# Patient Record
Sex: Female | Born: 1985 | Race: White | Hispanic: No | Marital: Married | Smoking: Former smoker
Health system: Southern US, Community
[De-identification: ages and names within clinical notes are randomized; demographics above are authoritative.]

## PROBLEM LIST (undated history)

## (undated) DIAGNOSIS — N390 Urinary tract infection, site not specified: Secondary | ICD-10-CM

## (undated) HISTORY — PX: TONSILLECTOMY: SUR1361

---
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
## 2005-03-10 ENCOUNTER — Inpatient Hospital Stay (HOSPITAL_COMMUNITY): Admission: AD | Admit: 2005-03-10 | Discharge: 2005-03-16 | Payer: Self-pay | Admitting: Obstetrics and Gynecology

## 2005-09-05 ENCOUNTER — Emergency Department (HOSPITAL_COMMUNITY): Admission: EM | Admit: 2005-09-05 | Discharge: 2005-09-05 | Payer: Self-pay | Admitting: Emergency Medicine

## 2007-03-05 ENCOUNTER — Emergency Department (HOSPITAL_COMMUNITY): Admission: EM | Admit: 2007-03-05 | Discharge: 2007-03-05 | Payer: Self-pay | Admitting: *Deleted

## 2007-03-07 ENCOUNTER — Emergency Department (HOSPITAL_COMMUNITY): Admission: EM | Admit: 2007-03-07 | Discharge: 2007-03-07 | Payer: Self-pay | Admitting: Emergency Medicine

## 2007-03-25 ENCOUNTER — Emergency Department (HOSPITAL_COMMUNITY): Admission: EM | Admit: 2007-03-25 | Discharge: 2007-03-25 | Payer: Self-pay | Admitting: Emergency Medicine

## 2007-10-02 ENCOUNTER — Emergency Department (HOSPITAL_COMMUNITY): Admission: EM | Admit: 2007-10-02 | Discharge: 2007-10-02 | Payer: Self-pay | Admitting: Emergency Medicine

## 2007-10-18 ENCOUNTER — Inpatient Hospital Stay (HOSPITAL_COMMUNITY): Admission: AD | Admit: 2007-10-18 | Discharge: 2007-10-20 | Payer: Self-pay | Admitting: Obstetrics & Gynecology

## 2008-08-29 ENCOUNTER — Emergency Department (HOSPITAL_BASED_OUTPATIENT_CLINIC_OR_DEPARTMENT_OTHER): Admission: EM | Admit: 2008-08-29 | Discharge: 2008-08-29 | Payer: Self-pay | Admitting: Emergency Medicine

## 2008-09-17 ENCOUNTER — Emergency Department (HOSPITAL_BASED_OUTPATIENT_CLINIC_OR_DEPARTMENT_OTHER): Admission: EM | Admit: 2008-09-17 | Discharge: 2008-09-17 | Payer: Self-pay | Admitting: Emergency Medicine

## 2008-10-28 ENCOUNTER — Emergency Department (HOSPITAL_COMMUNITY): Admission: EM | Admit: 2008-10-28 | Discharge: 2008-10-28 | Payer: Self-pay | Admitting: Emergency Medicine

## 2008-11-06 ENCOUNTER — Emergency Department (HOSPITAL_BASED_OUTPATIENT_CLINIC_OR_DEPARTMENT_OTHER): Admission: EM | Admit: 2008-11-06 | Discharge: 2008-11-06 | Payer: Self-pay | Admitting: Emergency Medicine

## 2009-02-10 IMAGING — US US OB COMP LESS 14 WK
2 series · 14 of 27 positions shown · non-contrast
Comparison: none

HISTORY: Early pregnancy, vomiting, pain

[Series 1: unknown · 0.23mm/px · 2 of 4 slices shown (1 of 2)]
[im 1/4]
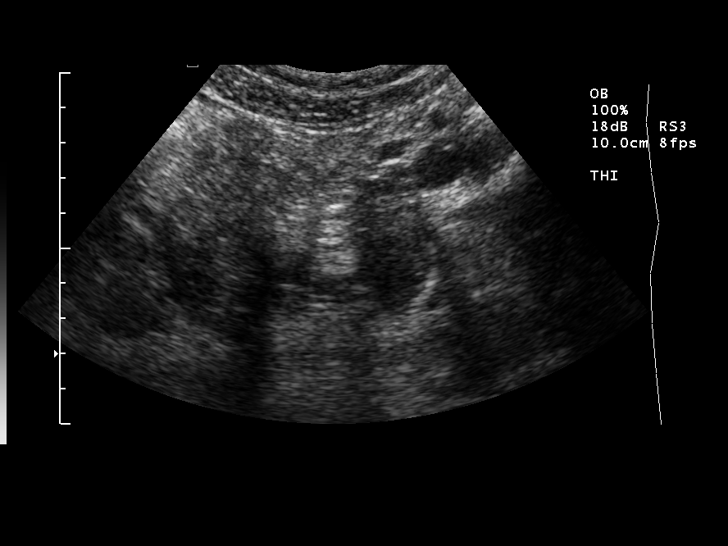
[im 3/4]
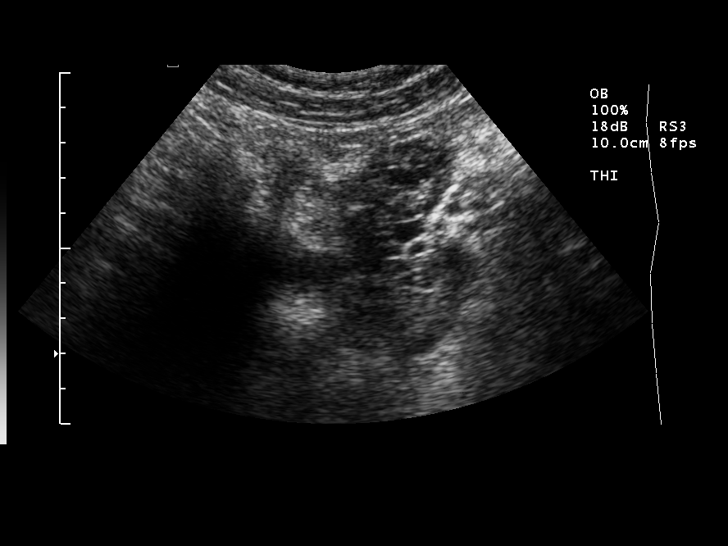

[Series 1: unknown · 0.27mm/px · 12 of 23 slices shown (2 of 2)]
[im 1/23]
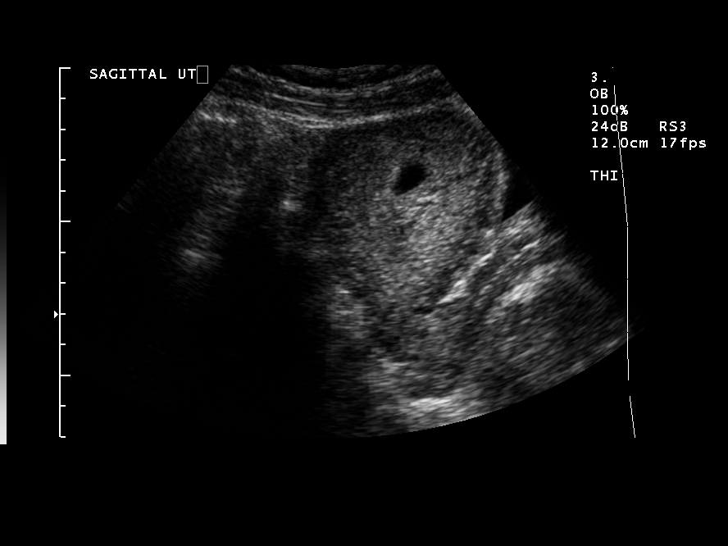
[im 3/23]
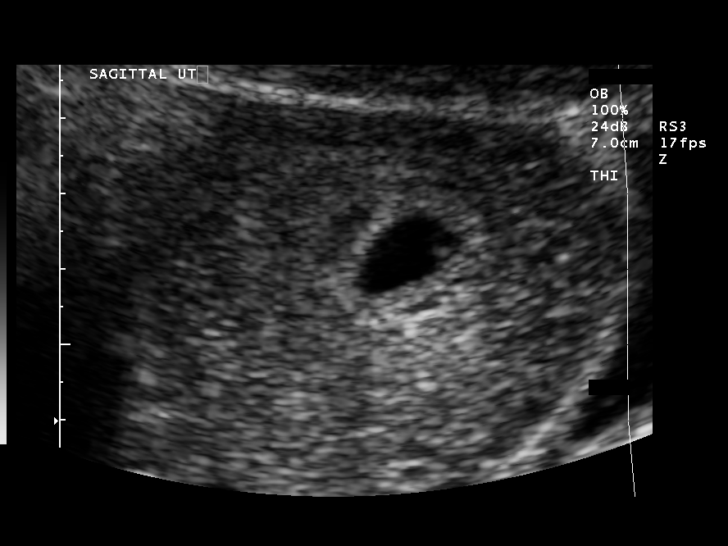
[im 5/23]
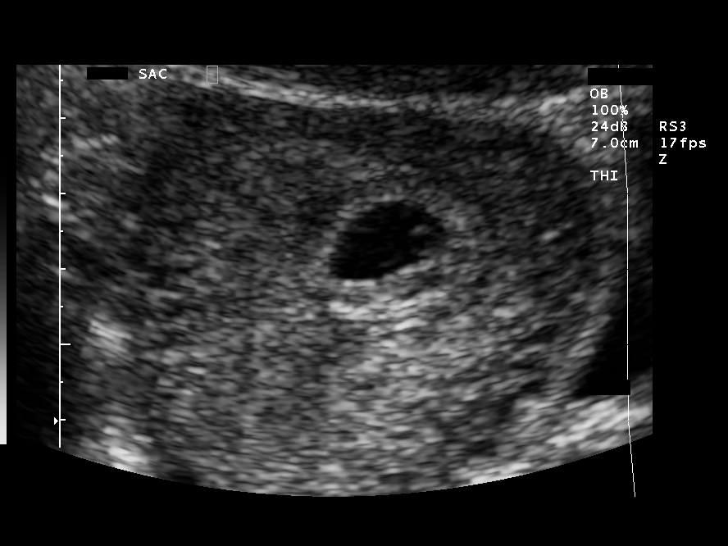
[im 7/23]
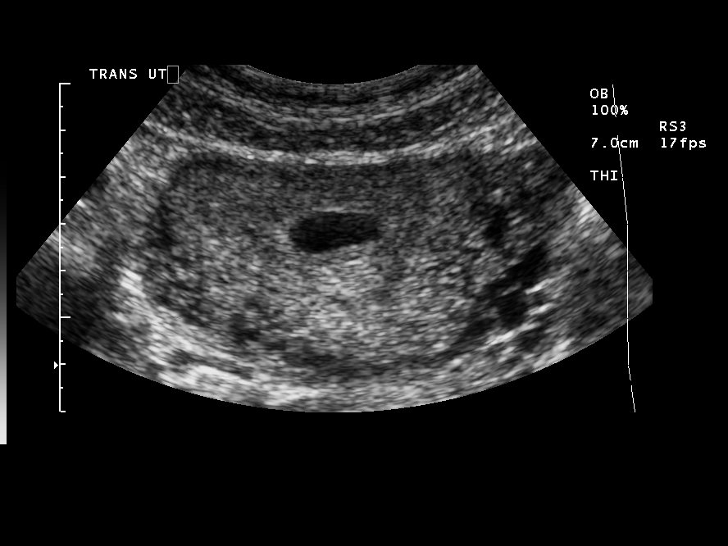
[im 9/23]
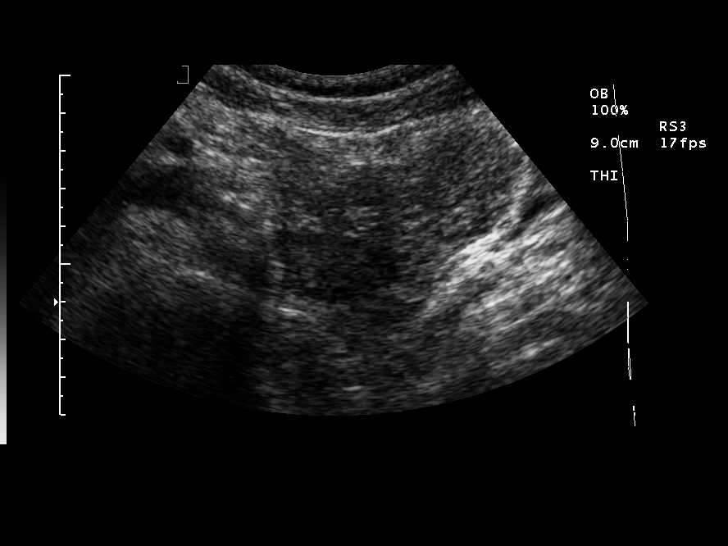
[im 11/23]
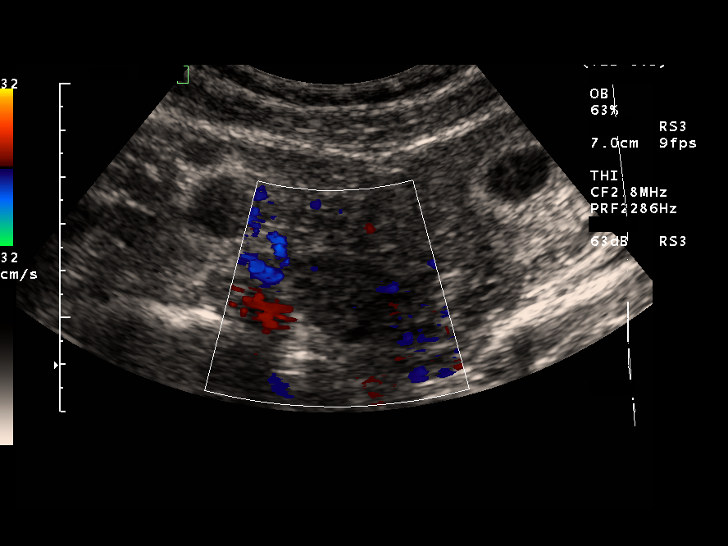
[im 13/23]
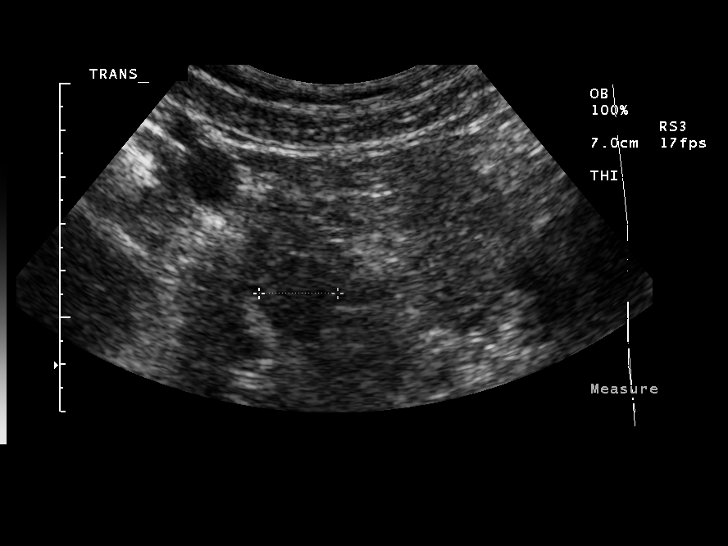
[im 15/23]
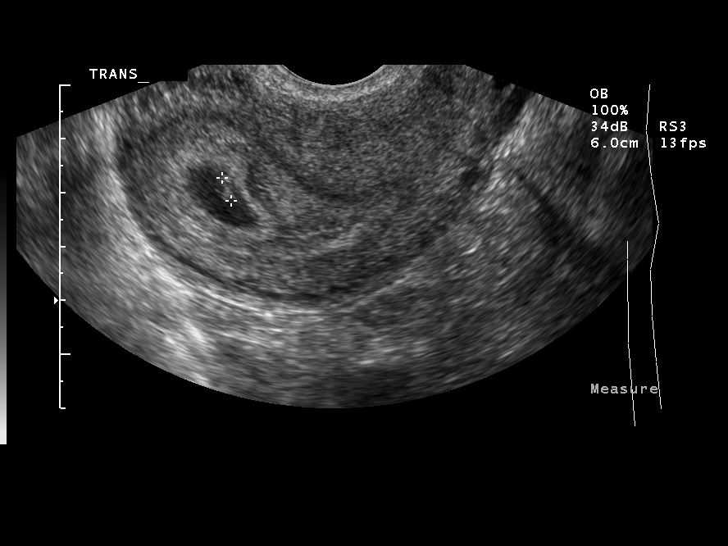
[im 17/23]
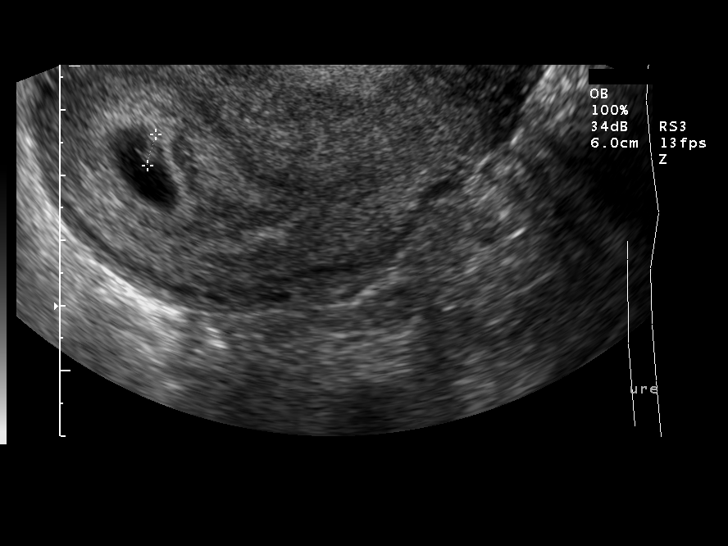
[im 19/23]
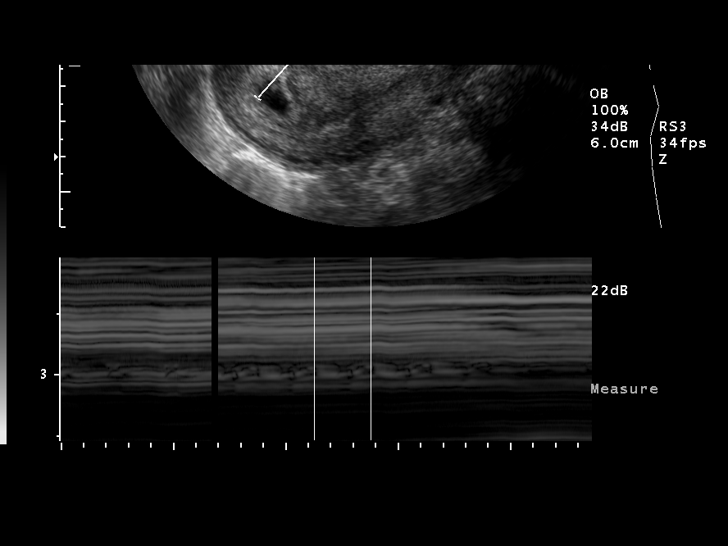
[im 21/23]
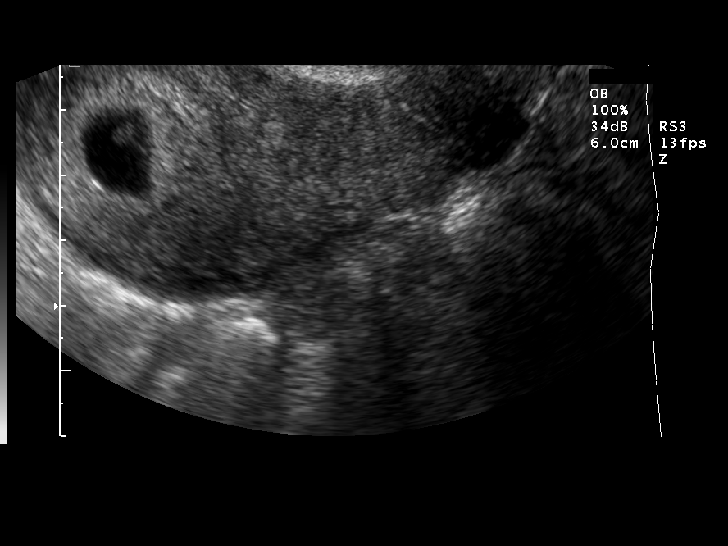
[im 23/23]
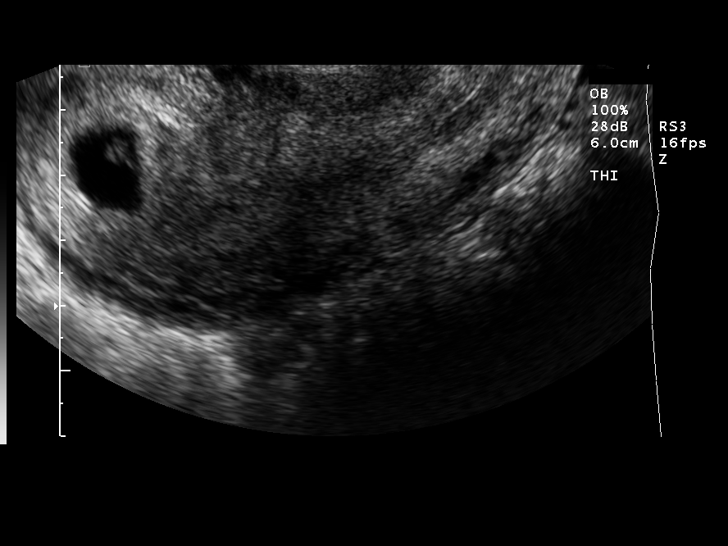

[14 of 27 positions shown; findings below may reference images not displayed]

ULTRASOUND OBSTETRIC COMPLETE LESS THAN 14 WEEKS:

Transabdominal sonography of gravid pelvis performed.

Gestational sac seen within uterus containing yolk sac and fetal pole.
Fetal cardiac activity detected at 120 beats per minute.
No subchorionic hemorrhage.
Crown-rump length 4.9 mm, corresponding to gestational age 6 weeks 2 days.
Right ovary normal size and morphology, 3.4 x 1.7 x 1.7 cm.
Left ovary normal size and morphology, 4.0 x 2.6 x 2.1 cm.
No free pelvic fluid or adnexal mass.
IMPRESSION: Single live intrauterine gestation measured at 6 weeks 2 days EGA, which
corresponds to an EDC of 10/29/2007.
No acute abnormalities.

## 2009-02-10 IMAGING — US US ABDOMEN COMPLETE
1 series · 14 of 25 positions shown · non-contrast
Comparison: none

HISTORY: Pregnant, vomiting

ULTRASOUND ABDOMEN COMPLETE:
TECHNIQUE: Complete abdominal ultrasound examination was performed including
evaluation of the liver, gallbladder, bile ducts, pancreas, kidneys, spleen,
IVC, and abdominal aorta.
Gallbladder normal without stones or wall thickening.
Common bile duct normal caliber, 3 mm diameter.
Liver, pancreas, and spleen normal appearance, with spleen 9.9 cm length.
Kidneys normal appearance, right 11.1 cm length and left 12.7 cm.
Aorta and IVC normal.
No ascites.

[Series 1: unknown · 0.28mm/px · 14 of 72 slices shown]
[im 1/72]
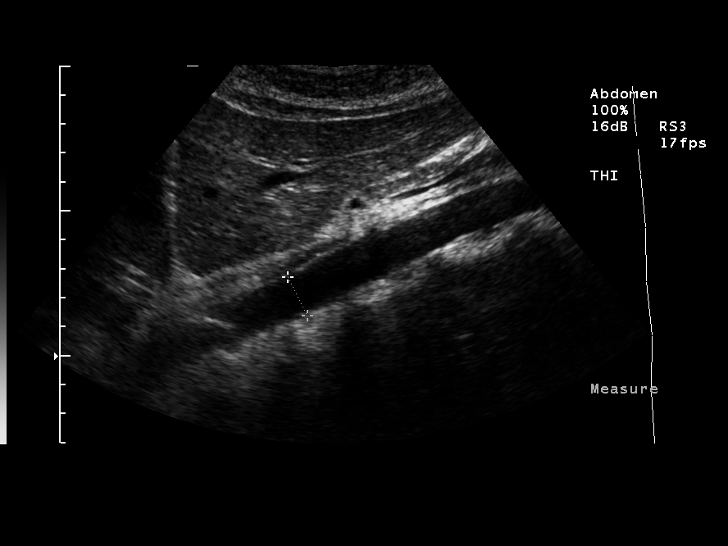
[im 6/72]
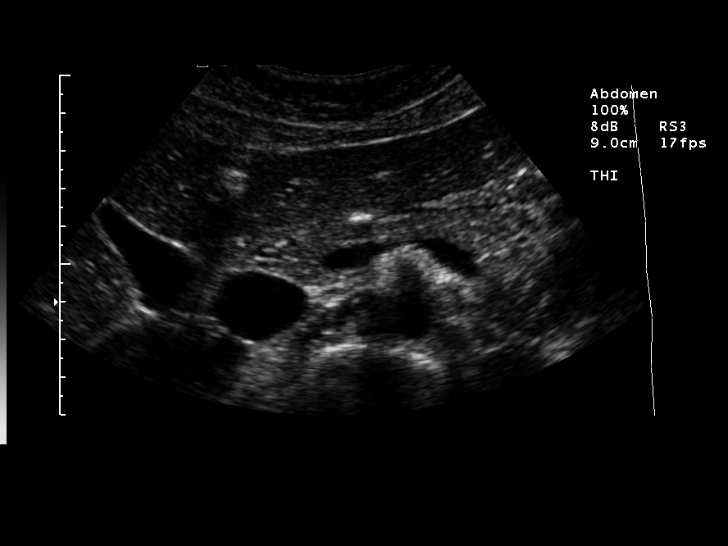
[im 12/72]
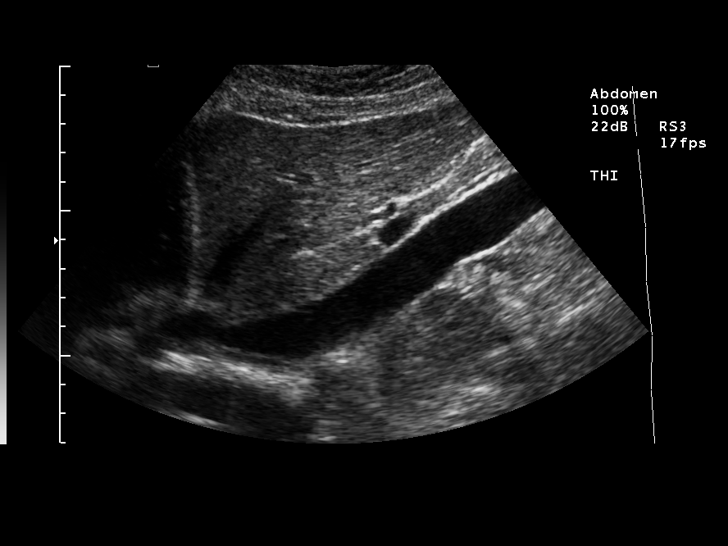
[im 18/72]
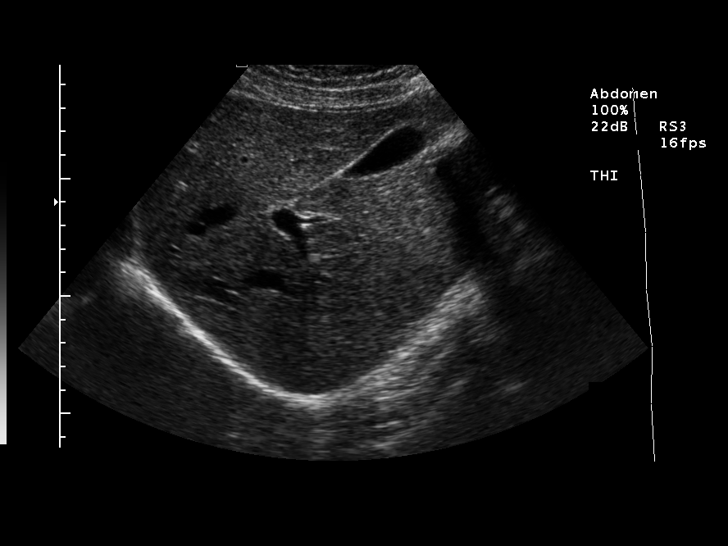
[im 24/72]
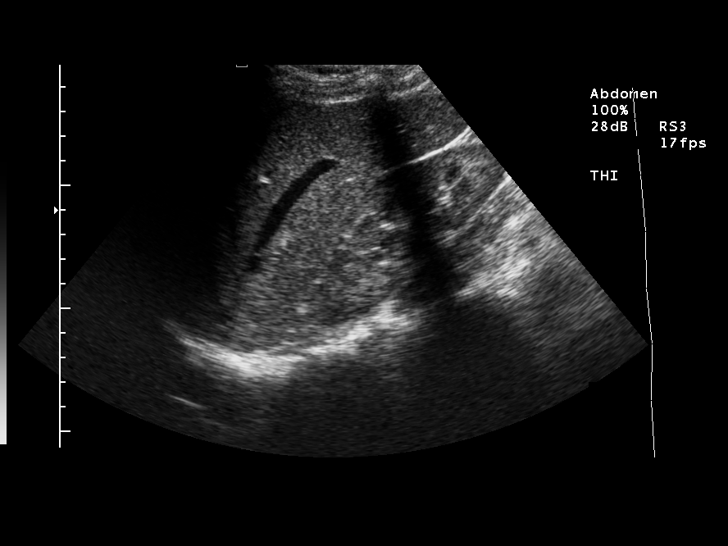
[im 27/72]
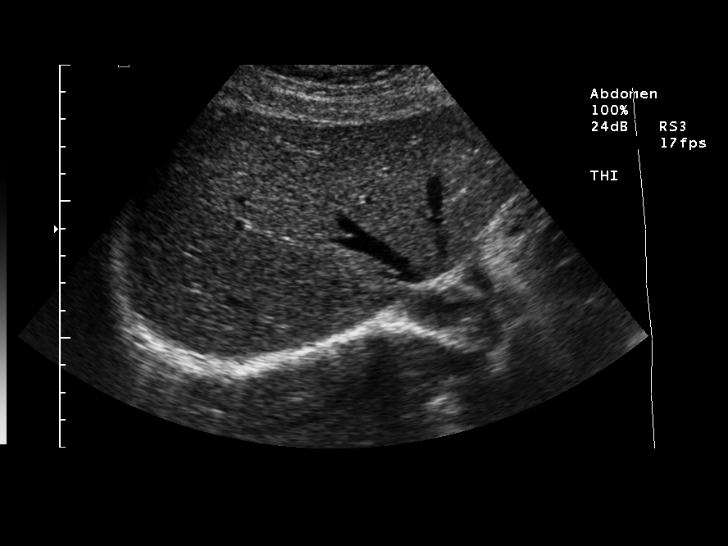
[im 33/72]
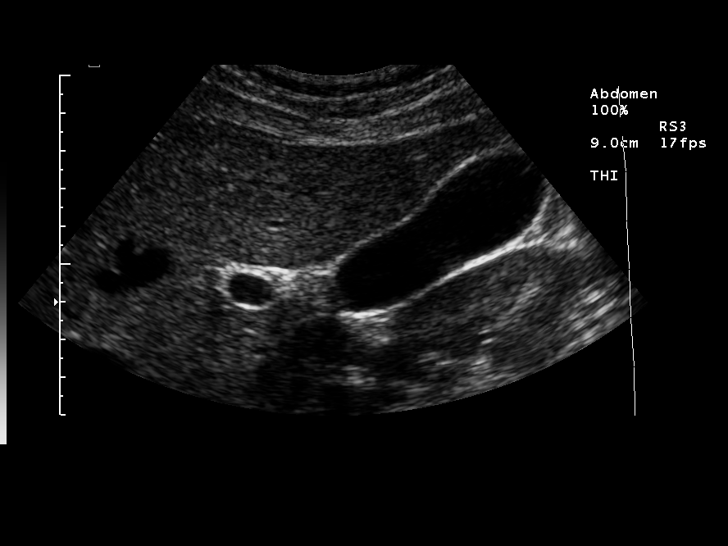
[im 39/72]
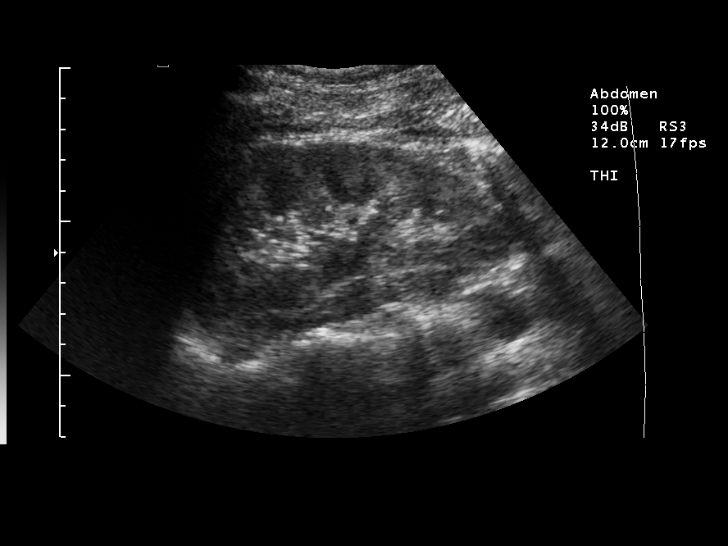
[im 45/72]
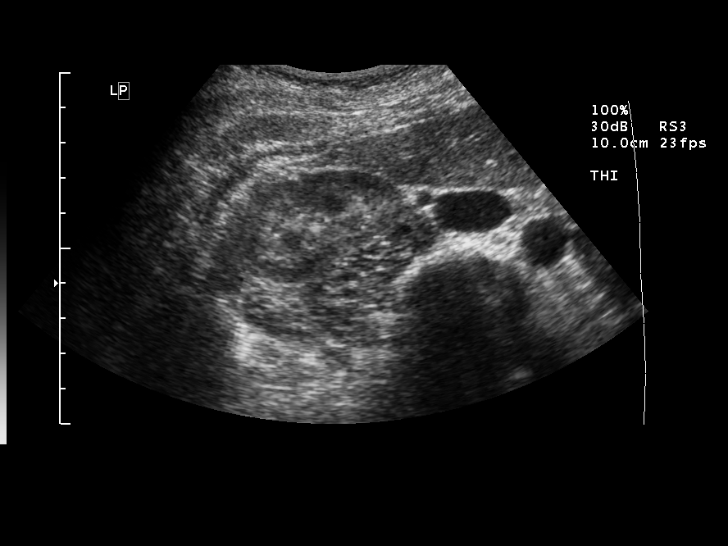
[im 48/72]
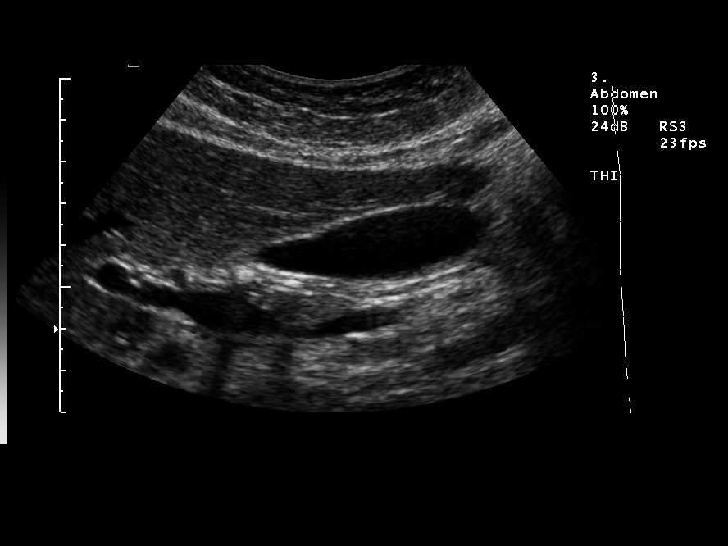
[im 54/72]
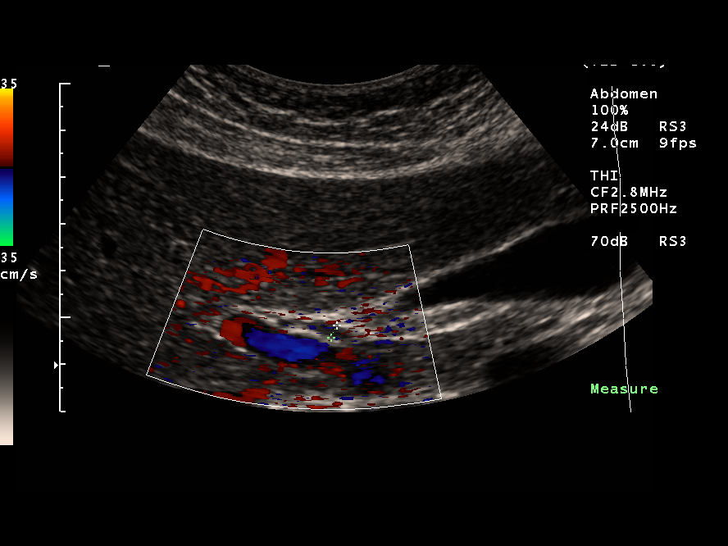
[im 60/72]
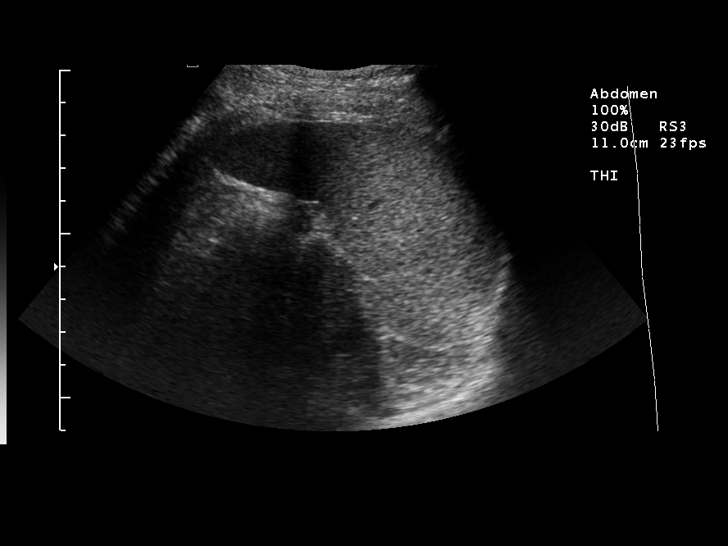
[im 66/72]
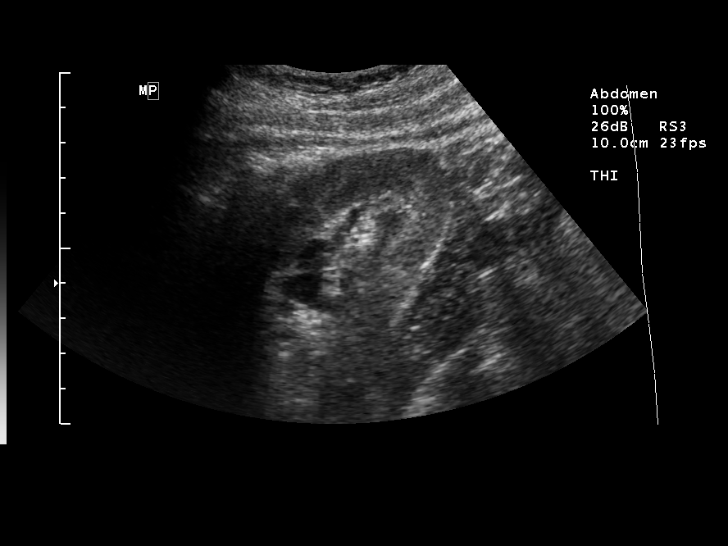
[im 72/72]
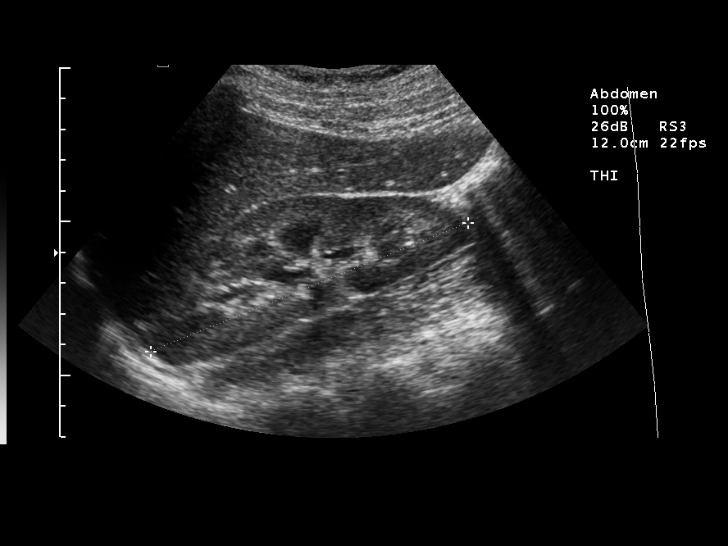

[14 of 25 positions shown; findings below may reference images not displayed]

IMPRESSION: Normal ultrasound abdomen.

## 2010-07-18 ENCOUNTER — Emergency Department (HOSPITAL_COMMUNITY)
Admission: EM | Admit: 2010-07-18 | Discharge: 2010-07-18 | Payer: Self-pay | Source: Home / Self Care | Admitting: Emergency Medicine

## 2010-08-03 NOTE — L&D Delivery Note (Signed)
Delivery Note At 10:50 PM a viable and healthy female was delivered via Vaginal, Spontaneous Delivery (Presentation: Left Occiput Anterior).  APGAR: 9, 9; weight 6 lb 14.9 oz (3144 g).   Placenta status: Intact, Spontaneous.  Cord: 3 vessels with the following complications: None.   Anesthesia: Epidural  Episiotomy: None Lacerations: None Suture Repair: none needed Est. Blood Loss (mL): 150  Mom to postpartum.  Baby to nursery-stable.  Chetan Kapat 06/02/2011, 11:19 PM  Present at birth and agree with above. Cam Hai 06/02/2011 11:22 PM

## 2010-10-13 LAB — URINALYSIS, ROUTINE W REFLEX MICROSCOPIC
Bilirubin Urine: NEGATIVE
Glucose, UA: NEGATIVE mg/dL
Ketones, ur: NEGATIVE mg/dL
Nitrite: NEGATIVE
Protein, ur: NEGATIVE mg/dL
Specific Gravity, Urine: 1.025 (ref 1.005–1.030)
Urobilinogen, UA: 0.2 mg/dL (ref 0.0–1.0)
pH: 6 (ref 5.0–8.0)

## 2010-10-13 LAB — URINE CULTURE
Colony Count: 10000
Culture  Setup Time: 201112170142

## 2010-10-13 LAB — GC/CHLAMYDIA PROBE AMP, GENITAL
Chlamydia, DNA Probe: POSITIVE — AB
GC Probe Amp, Genital: NEGATIVE

## 2010-10-13 LAB — WET PREP, GENITAL
Trich, Wet Prep: NONE SEEN
Yeast Wet Prep HPF POC: NONE SEEN

## 2010-10-13 LAB — URINE MICROSCOPIC-ADD ON

## 2010-10-13 LAB — POCT PREGNANCY, URINE: Preg Test, Ur: NEGATIVE

## 2010-10-16 LAB — ANTIBODY SCREEN: Antibody Screen: NEGATIVE

## 2010-10-16 LAB — RPR: RPR: NONREACTIVE

## 2010-10-16 LAB — RUBELLA ANTIBODY, IGM: Rubella: UNDETERMINED

## 2010-10-16 LAB — GC/CHLAMYDIA PROBE AMP, GENITAL
Chlamydia: NEGATIVE
Gonorrhea: NEGATIVE

## 2010-10-20 ENCOUNTER — Emergency Department (HOSPITAL_COMMUNITY)
Admission: EM | Admit: 2010-10-20 | Discharge: 2010-10-20 | Disposition: A | Discharge status: Home / Self Care | Payer: Medicaid Other | Attending: Emergency Medicine | Admitting: Emergency Medicine

## 2010-10-20 DIAGNOSIS — O210 Mild hyperemesis gravidarum: Secondary | ICD-10-CM | POA: Insufficient documentation

## 2010-10-20 LAB — DIFFERENTIAL
Basophils Absolute: 0 10*3/uL (ref 0.0–0.1)
Basophils Relative: 0 % (ref 0–1)
Eosinophils Absolute: 0.1 10*3/uL (ref 0.0–0.7)
Eosinophils Relative: 1 % (ref 0–5)
Lymphocytes Relative: 30 % (ref 12–46)
Lymphs Abs: 2.9 10*3/uL (ref 0.7–4.0)
Monocytes Absolute: 1 10*3/uL (ref 0.1–1.0)
Monocytes Relative: 10 % (ref 3–12)
Neutro Abs: 5.7 10*3/uL (ref 1.7–7.7)
Neutrophils Relative %: 59 % (ref 43–77)

## 2010-10-20 LAB — COMPREHENSIVE METABOLIC PANEL
ALT: 14 U/L (ref 0–35)
AST: 16 U/L (ref 0–37)
Albumin: 4 g/dL (ref 3.5–5.2)
Alkaline Phosphatase: 52 U/L (ref 39–117)
BUN: 10 mg/dL (ref 6–23)
CO2: 20 mEq/L (ref 19–32)
Calcium: 9.3 mg/dL (ref 8.4–10.5)
Chloride: 104 mEq/L (ref 96–112)
Creatinine, Ser: 0.5 mg/dL (ref 0.4–1.2)
GFR calc Af Amer: >60 mL/min (ref >60)
GFR calc non Af Amer: >60 mL/min (ref >60)
Glucose, Bld: 85 mg/dL (ref 70–99)
Potassium: 3.6 mEq/L (ref 3.5–5.1)
Sodium: 136 mEq/L (ref 135–145)
Total Bilirubin: 0.8 mg/dL (ref 0.3–1.2)
Total Protein: 6.6 g/dL (ref 6.0–8.3)

## 2010-10-20 LAB — CBC
HCT: 35.2 % — ABNORMAL LOW (ref 36.0–46.0)
Hemoglobin: 12.2 g/dL (ref 12.0–15.0)
MCH: 30.8 pg (ref 26.0–34.0)
MCHC: 34.7 g/dL (ref 30.0–36.0)
MCV: 88.9 fL (ref 78.0–100.0)
Platelets: 337 10*3/uL (ref 150–400)
RBC: 3.96 MIL/uL (ref 3.87–5.11)
RDW: 13.6 % (ref 11.5–15.5)
WBC: 9.7 10*3/uL (ref 4.0–10.5)

## 2010-10-20 LAB — LIPASE, BLOOD: Lipase: 22 U/L (ref 11–59)

## 2010-11-11 ENCOUNTER — Other Ambulatory Visit: Payer: Self-pay | Admitting: Obstetrics & Gynecology

## 2010-11-11 ENCOUNTER — Other Ambulatory Visit (HOSPITAL_COMMUNITY)
Admission: RE | Admit: 2010-11-11 | Discharge: 2010-11-11 | Disposition: A | Discharge status: Home / Self Care | Payer: Medicaid Other | Source: Ambulatory Visit | Attending: Obstetrics & Gynecology | Admitting: Obstetrics & Gynecology

## 2010-11-11 DIAGNOSIS — Z01419 Encounter for gynecological examination (general) (routine) without abnormal findings: Secondary | ICD-10-CM | POA: Insufficient documentation

## 2010-11-11 DIAGNOSIS — Z113 Encounter for screening for infections with a predominantly sexual mode of transmission: Secondary | ICD-10-CM | POA: Insufficient documentation

## 2010-11-12 LAB — URINALYSIS, ROUTINE W REFLEX MICROSCOPIC
Glucose, UA: NEGATIVE mg/dL
Ketones, ur: 15 mg/dL — AB
Nitrite: NEGATIVE
Protein, ur: 100 mg/dL — AB
Specific Gravity, Urine: 1.035 — ABNORMAL HIGH (ref 1.005–1.030)
Urobilinogen, UA: 0.2 mg/dL (ref 0.0–1.0)
pH: 5.5 (ref 5.0–8.0)

## 2010-11-12 LAB — URINE MICROSCOPIC-ADD ON

## 2010-11-12 LAB — DIFFERENTIAL
Basophils Absolute: 0 10*3/uL (ref 0.0–0.1)
Basophils Relative: 0 % (ref 0–1)
Eosinophils Absolute: 0 10*3/uL (ref 0.0–0.7)
Eosinophils Relative: 0 % (ref 0–5)
Lymphocytes Relative: 6 % — ABNORMAL LOW (ref 12–46)
Lymphs Abs: 1.5 10*3/uL (ref 0.7–4.0)
Monocytes Absolute: 1 10*3/uL (ref 0.1–1.0)
Monocytes Relative: 4 % (ref 3–12)
Neutro Abs: 22.1 10*3/uL — ABNORMAL HIGH (ref 1.7–7.7)
Neutrophils Relative %: 90 % — ABNORMAL HIGH (ref 43–77)

## 2010-11-12 LAB — CBC
HCT: 44.6 % (ref 36.0–46.0)
Hemoglobin: 15.3 g/dL — ABNORMAL HIGH (ref 12.0–15.0)
MCHC: 34.2 g/dL (ref 30.0–36.0)
MCV: 91.2 fL (ref 78.0–100.0)
Platelets: 402 10*3/uL — ABNORMAL HIGH (ref 150–400)
RBC: 4.89 MIL/uL (ref 3.87–5.11)
RDW: 13.5 % (ref 11.5–15.5)
WBC: 24.6 10*3/uL — ABNORMAL HIGH (ref 4.0–10.5)

## 2010-11-12 LAB — BASIC METABOLIC PANEL
BUN: 17 mg/dL (ref 6–23)
CO2: 18 mEq/L — ABNORMAL LOW (ref 19–32)
Calcium: 10.1 mg/dL (ref 8.4–10.5)
Chloride: 109 mEq/L (ref 96–112)
Creatinine, Ser: 0.6 mg/dL (ref 0.4–1.2)
GFR calc Af Amer: >60 mL/min (ref >60)
GFR calc non Af Amer: >60 mL/min (ref >60)
Glucose, Bld: 122 mg/dL — ABNORMAL HIGH (ref 70–99)
Potassium: 5.2 mEq/L — ABNORMAL HIGH (ref 3.5–5.1)
Sodium: 143 mEq/L (ref 135–145)

## 2010-11-12 LAB — PREGNANCY, URINE: Preg Test, Ur: NEGATIVE

## 2010-11-25 ENCOUNTER — Emergency Department (HOSPITAL_BASED_OUTPATIENT_CLINIC_OR_DEPARTMENT_OTHER)
Admission: EM | Admit: 2010-11-25 | Discharge: 2010-11-25 | Disposition: A | Discharge status: Home / Self Care | Payer: Medicaid Other | Attending: Emergency Medicine | Admitting: Emergency Medicine

## 2010-11-25 DIAGNOSIS — O2390 Unspecified genitourinary tract infection in pregnancy, unspecified trimester: Secondary | ICD-10-CM | POA: Insufficient documentation

## 2010-11-25 DIAGNOSIS — N390 Urinary tract infection, site not specified: Secondary | ICD-10-CM | POA: Insufficient documentation

## 2010-11-25 DIAGNOSIS — O210 Mild hyperemesis gravidarum: Secondary | ICD-10-CM | POA: Insufficient documentation

## 2010-11-25 DIAGNOSIS — K219 Gastro-esophageal reflux disease without esophagitis: Secondary | ICD-10-CM | POA: Insufficient documentation

## 2010-11-25 LAB — BASIC METABOLIC PANEL
CO2: 23 mEq/L (ref 19–32)
Glucose, Bld: 79 mg/dL (ref 70–99)
Potassium: 4 mEq/L (ref 3.5–5.1)
Sodium: 140 mEq/L (ref 135–145)

## 2010-11-25 LAB — DIFFERENTIAL
Basophils Relative: 0 % (ref 0–1)
Eosinophils Absolute: 0.1 10*3/uL (ref 0.0–0.7)
Eosinophils Relative: 1 % (ref 0–5)
Lymphs Abs: 2.2 10*3/uL (ref 0.7–4.0)

## 2010-11-25 LAB — URINALYSIS, ROUTINE W REFLEX MICROSCOPIC
Bilirubin Urine: NEGATIVE
Ketones, ur: NEGATIVE mg/dL
Nitrite: NEGATIVE
Specific Gravity, Urine: 1.023 (ref 1.005–1.030)
Urobilinogen, UA: 1 mg/dL (ref 0.0–1.0)

## 2010-11-25 LAB — URINE MICROSCOPIC-ADD ON

## 2010-11-25 LAB — CBC
MCV: 87.8 fL (ref 78.0–100.0)
Platelets: 328 10*3/uL (ref 150–400)
RDW: 12.7 % (ref 11.5–15.5)
WBC: 10 10*3/uL (ref 4.0–10.5)

## 2010-11-26 LAB — URINE CULTURE
Colony Count: 30000
Culture  Setup Time: 201204242005

## 2011-04-24 ENCOUNTER — Inpatient Hospital Stay (HOSPITAL_COMMUNITY)
Admission: AD | Admit: 2011-04-24 | Discharge: 2011-04-24 | Disposition: A | Discharge status: Home / Self Care | Payer: Medicaid Other | Source: Ambulatory Visit | Attending: Obstetrics & Gynecology | Admitting: Obstetrics & Gynecology

## 2011-04-24 ENCOUNTER — Encounter (HOSPITAL_COMMUNITY): Payer: Self-pay

## 2011-04-24 DIAGNOSIS — N949 Unspecified condition associated with female genital organs and menstrual cycle: Secondary | ICD-10-CM

## 2011-04-24 DIAGNOSIS — R1032 Left lower quadrant pain: Secondary | ICD-10-CM | POA: Insufficient documentation

## 2011-04-24 DIAGNOSIS — O99891 Other specified diseases and conditions complicating pregnancy: Secondary | ICD-10-CM | POA: Insufficient documentation

## 2011-04-24 LAB — URINE MICROSCOPIC-ADD ON

## 2011-04-24 LAB — URINALYSIS, ROUTINE W REFLEX MICROSCOPIC
Ketones, ur: NEGATIVE mg/dL
Nitrite: NEGATIVE
Protein, ur: NEGATIVE mg/dL

## 2011-04-24 MED ORDER — CYCLOBENZAPRINE HCL 10 MG PO TABS: 10.0000 mg | ORAL_TABLET | Freq: Three times a day (TID) | ORAL | Status: AC | PRN

## 2011-04-24 NOTE — Progress Notes (Signed)
Pt states she started having abdominal pain this am, getting more painful and worse with movement. No leaking or bleeding and reports good fetal movement.

## 2011-04-24 NOTE — ED Provider Notes (Signed)
History     Chief Complaint  Patient presents with  . Abdominal Pain   HPI [name] is a 25 year old G3P1102 at [redacted]w[redacted]d presenting with LLQ pain.  The pain started this morning.  It is described as sharp, does occasionally shot down into the groin.  Worse with movement, better with rest.  Has not tried anything yet. Has had some mild nausea, no vomiting, has 4 loose stools this morning, but none since.  Also states having some "leaking" for the last several weeks of a clear fluid with a "rubber-like" odor.  Denies vaginal discharge, vaginal itching, dysuria.  States did not know she was in labor with prior pregnancies (one was preterm at 36 weeks).  Denies vaginal bleeding, headaches, vision changes, edema.  OB History    Grav Para Term Preterm Abortions TAB SAB Ect Mult Living   3 2 1 1      2       Past Medical History  Diagnosis Date  . No pertinent past medical history     Past Surgical History  Procedure Date  . Tonsillectomy     No family history on file.  History  Substance Use Topics  . Smoking status: Former Games developer  . Smokeless tobacco: Not on file  . Alcohol Use: No    Allergies: No Known Allergies  Prescriptions prior to admission  Medication Sig Dispense Refill  . guaifenesin (ROBITUSSIN) 100 MG/5ML syrup Take 200 mg by mouth 3 (three) times daily as needed. cough       . Pediatric Multivit-Minerals-C (GUMMI BEAR MULTIVITAMIN/MIN) CHEW Chew 2 tablets by mouth daily.          Review of Systems  Constitutional: Negative for fever and chills.  HENT: Negative for sore throat.   Eyes: Negative for blurred vision.  Respiratory: Negative for cough and shortness of breath.   Cardiovascular: Negative for palpitations.  Gastrointestinal: Positive for nausea and abdominal pain (see HPI). Negative for vomiting and diarrhea.  Genitourinary: Negative for dysuria.  Skin: Negative for rash.  Neurological: Negative for dizziness and headaches.   Physical Exam   Blood  pressure 126/70, pulse 94, temperature 97.9 F (36.6 C), temperature source Oral, resp. rate 20, height 5\' 3"  (1.6 m), weight 159 lb (72.122 kg), SpO2 98.00%.  Physical Exam  Constitutional: She is oriented to person, place, and time. She appears well-developed and well-nourished. No distress.  HENT:  Head: Normocephalic and atraumatic.  Mouth/Throat: Oropharynx is clear and moist.  Eyes: No scleral icterus.  Neck: Normal range of motion. Neck supple.  Cardiovascular: Normal rate, regular rhythm and intact distal pulses.  Exam reveals no gallop.   No murmur heard. Respiratory: Effort normal and breath sounds normal. She has no wheezes. She has no rales.  GI: Soft. Bowel sounds are normal.       Gravid, fundal height 33cm, mild tenderness to palpation on left at level of umbilicus  Genitourinary:       Normal external genitalia.  Normal vagina, small amount of thin white discharge. No pooling.  Musculoskeletal: She exhibits no edema and no tenderness.  Neurological: She is alert and oriented to person, place, and time.  Skin: Skin is warm and dry. No rash noted.  Psychiatric: She has a normal mood and affect. Her behavior is normal.   Dilation: Fingertip Effacement (%): Thick Cervical Position: Posterior Presentation:  (OUT OF PELVIS ) Exam by:: DR. BOOTH   FHT: 140, moderate variability, accels present, decels absent Contractions: none  MAU Course  Procedures Fern test: negative  Urine dipstick shows negative for all components, positive for small leukocytes.   Assessment and Plan  25 year old G3P1102 at 33.6 with LLQ pain likely MSK vs round ligament vs early gastroenteritis -discussed tylenol for pain -will give flexeril as well -will give preterm labor precautions -discharge home  BOOTH, Maeson Purohit 04/24/2011, 6:54 PM

## 2011-04-27 LAB — CBC
HCT: 32.2 — ABNORMAL LOW
Hemoglobin: 11.3 — ABNORMAL LOW
MCHC: 34.8
MCHC: 35.2
MCV: 91.8
RBC: 3.09 — ABNORMAL LOW
RDW: 14.1

## 2011-04-27 NOTE — ED Provider Notes (Signed)
Agree 

## 2011-05-15 LAB — URINALYSIS, ROUTINE W REFLEX MICROSCOPIC
Bilirubin Urine: NEGATIVE
Glucose, UA: NEGATIVE
Ketones, ur: NEGATIVE
Protein, ur: NEGATIVE

## 2011-05-18 LAB — DIFFERENTIAL
Basophils Absolute: 0
Basophils Relative: 0
Eosinophils Absolute: 0
Eosinophils Relative: 0
Monocytes Absolute: 0.7
Monocytes Relative: 7
Neutro Abs: 7.8 — ABNORMAL HIGH

## 2011-05-18 LAB — URINALYSIS, ROUTINE W REFLEX MICROSCOPIC
Bilirubin Urine: NEGATIVE
Glucose, UA: NEGATIVE
Hgb urine dipstick: NEGATIVE
Ketones, ur: >80 — AB
Ketones, ur: >80 — AB
Nitrite: NEGATIVE
Protein, ur: NEGATIVE
Specific Gravity, Urine: 1.015
Urobilinogen, UA: 0.2
pH: 8.5 — ABNORMAL HIGH

## 2011-05-18 LAB — STREP B DNA PROBE: GBS: NEGATIVE

## 2011-05-18 LAB — WET PREP, GENITAL

## 2011-05-18 LAB — CBC
HCT: 37.4
MCV: 90
RBC: 4.15
WBC: 11.2 — ABNORMAL HIGH

## 2011-05-18 LAB — URINE CULTURE
Colony Count: NO GROWTH
Culture: NO GROWTH

## 2011-05-18 LAB — LIPASE, BLOOD: Lipase: 16

## 2011-05-18 LAB — URINE MICROSCOPIC-ADD ON

## 2011-05-30 ENCOUNTER — Inpatient Hospital Stay (HOSPITAL_COMMUNITY)
Admission: AD | Admit: 2011-05-30 | Discharge: 2011-05-30 | Disposition: A | Discharge status: Home / Self Care | Payer: Medicaid Other | Source: Ambulatory Visit | Attending: Family Medicine | Admitting: Family Medicine

## 2011-05-30 ENCOUNTER — Encounter (HOSPITAL_COMMUNITY): Payer: Self-pay | Admitting: *Deleted

## 2011-05-30 DIAGNOSIS — O99891 Other specified diseases and conditions complicating pregnancy: Secondary | ICD-10-CM | POA: Insufficient documentation

## 2011-05-30 DIAGNOSIS — O212 Late vomiting of pregnancy: Secondary | ICD-10-CM | POA: Insufficient documentation

## 2011-05-30 DIAGNOSIS — R109 Unspecified abdominal pain: Secondary | ICD-10-CM | POA: Insufficient documentation

## 2011-05-30 LAB — URINALYSIS, ROUTINE W REFLEX MICROSCOPIC
Bilirubin Urine: NEGATIVE
Glucose, UA: NEGATIVE mg/dL
Hgb urine dipstick: NEGATIVE
Protein, ur: NEGATIVE mg/dL
Urobilinogen, UA: 2 mg/dL — ABNORMAL HIGH (ref 0.0–1.0)

## 2011-05-30 LAB — POCT FERN TEST: Fern Test: NEGATIVE

## 2011-05-30 LAB — URINE MICROSCOPIC-ADD ON

## 2011-05-30 MED ORDER — PROMETHAZINE HCL 25 MG PO TABS
12.5000 mg | ORAL_TABLET | Freq: Four times a day (QID) | ORAL | Status: DC | PRN
  Administered 2011-05-30: 12.5 mg via ORAL
  Filled 2011-05-30: qty 1

## 2011-05-30 MED ORDER — OXYCODONE-ACETAMINOPHEN 5-325 MG PO TABS
1.0000 | ORAL_TABLET | Freq: Once | ORAL | Status: AC
  Administered 2011-05-30: 1 via ORAL
  Filled 2011-05-30: qty 1

## 2011-05-30 NOTE — ED Notes (Signed)
Dr Clinton Sawyer on unit

## 2011-05-30 NOTE — Progress Notes (Signed)
Pain is worse with walking and when lifts R leg

## 2011-05-30 NOTE — ED Notes (Signed)
Dr Clinton Sawyer notified of pt's admission and status. Will see pt

## 2011-05-30 NOTE — ED Provider Notes (Signed)
History     Chief Complaint  Patient presents with  . Pelvic Pain   HPI Patient report sharp shooting pain in lower abdomen and pelvis starting at 6:00 PM. Pain is intermittent and exacerbated by walking. She has not taken any medication to relieve it. She has not had pain like this earlier or pregnancy or with prior pregnancy. [name] does not think that she has had a rupture of membranes and denies vaginal bleeding, herpes outbreak, and recent trauma. Patient is concerned that the pain represents her going into labor. She says that her cervix was dilated to 1cm at her last OBGY visit on 05/26/11.  OB History    Grav Para Term Preterm Abortions TAB SAB Ect Mult Living   3 2 1 1      2       Past Medical History  Diagnosis Date  . No pertinent past medical history     Past Surgical History  Procedure Date  . Tonsillectomy     No family history on file.  History  Substance Use Topics  . Smoking status: Former Games developer  . Smokeless tobacco: Not on file  . Alcohol Use: No    Allergies: No Known Allergies  Prescriptions prior to admission  Medication Sig Dispense Refill  . guaifenesin (ROBITUSSIN) 100 MG/5ML syrup Take 200 mg by mouth 3 (three) times daily as needed. cough       . Pediatric Multivit-Minerals-C (GUMMI BEAR MULTIVITAMIN/MIN) CHEW Chew 2 tablets by mouth daily.          ROS Positive for nausea and vomiting x 2, urinary incontinence; Negative for fever and chills  Physical Exam   Blood pressure 123/67, pulse 84, temperature 97.5 F (36.4 C), temperature source Oral, resp. rate 20, height 5\' 2"  (1.575 m), weight 76.204 kg (168 lb).  Dilation: Closed Cervical Position: Posterior Exam by:: Dr Clinton Sawyer- fern slide made before sve  Fern Test: Negative   Physical Exam Vital signs normal Gen: Alert and oriented, mild discomfort Heart: RRR Lungs: CTA-B Abd: Gravid, non-tender Feet: minimal swelling, pulses intact   MAU Course  Procedures Tocometer:  irregular contractions every 10 minutes Fetoscope: baseline 140, moderate variability, accelerations present, 1 variable deceleration in 30 minutes  MDM No evidence of active labor   Assessment and Plan  Patient 38.0 EGA and not in active labor. 1. Pain - likely round ligament pain, give one percocet now 2. Nausea/Vomiting - promethazine mg po x 1 3. Follow-up: [facility] on 10/30   Mat Carne 05/30/2011, 2:42 AM

## 2011-05-30 NOTE — Progress Notes (Signed)
G3P2 at 38wks. Sharp pains in vagina since 1800. Worse with walking and when lifts R leg. Some white vag d/c.

## 2011-05-30 NOTE — Progress Notes (Signed)
Written and verbal d/c instructions given and understanding voiced. Pt d/c with spouse.

## 2011-05-30 NOTE — Progress Notes (Signed)
Dr Clinton Sawyer did spec exam and fern slide obtained. Pt tol well.

## 2011-06-02 ENCOUNTER — Encounter (HOSPITAL_COMMUNITY): Payer: Self-pay | Admitting: *Deleted

## 2011-06-02 ENCOUNTER — Encounter (HOSPITAL_COMMUNITY): Payer: Self-pay | Admitting: Anesthesiology

## 2011-06-02 ENCOUNTER — Inpatient Hospital Stay (HOSPITAL_COMMUNITY): Payer: Medicaid Other | Admitting: Anesthesiology

## 2011-06-02 ENCOUNTER — Inpatient Hospital Stay (HOSPITAL_COMMUNITY)
Admission: AD | Admit: 2011-06-02 | Discharge: 2011-06-04 | DRG: 775 | Disposition: A | Discharge status: Home / Self Care | Payer: Medicaid Other | Source: Ambulatory Visit | Attending: Obstetrics & Gynecology | Admitting: Obstetrics & Gynecology

## 2011-06-02 DIAGNOSIS — IMO0001 Reserved for inherently not codable concepts without codable children: Secondary | ICD-10-CM

## 2011-06-02 HISTORY — DX: Urinary tract infection, site not specified: N39.0

## 2011-06-02 LAB — CBC
Hemoglobin: 11 g/dL — ABNORMAL LOW (ref 12.0–15.0)
MCH: 29.7 pg (ref 26.0–34.0)
RBC: 3.7 MIL/uL — ABNORMAL LOW (ref 3.87–5.11)
WBC: 10.8 10*3/uL — ABNORMAL HIGH (ref 4.0–10.5)

## 2011-06-02 LAB — RPR: RPR Ser Ql: NONREACTIVE

## 2011-06-02 MED ORDER — FENTANYL 2.5 MCG/ML BUPIVACAINE 1/10 % EPIDURAL INFUSION (WH - ANES)
14.0000 mL/h | INTRAMUSCULAR | Status: DC
  Administered 2011-06-02 (×2): 14 mL/h via EPIDURAL
  Filled 2011-06-02 (×2): qty 60

## 2011-06-02 MED ORDER — DIPHENHYDRAMINE HCL 50 MG/ML IJ SOLN: 12.5000 mg | INTRAMUSCULAR | Status: DC | PRN

## 2011-06-02 MED ORDER — EPHEDRINE 5 MG/ML INJ
10.0000 mg | INTRAVENOUS | Status: DC | PRN
  Filled 2011-06-02: qty 4

## 2011-06-02 MED ORDER — TERBUTALINE SULFATE 1 MG/ML IJ SOLN: 0.2500 mg | Freq: Once | INTRAMUSCULAR | Status: AC | PRN

## 2011-06-02 MED ORDER — PHENYLEPHRINE 40 MCG/ML (10ML) SYRINGE FOR IV PUSH (FOR BLOOD PRESSURE SUPPORT)
80.0000 ug | PREFILLED_SYRINGE | INTRAVENOUS | Status: DC | PRN
  Filled 2011-06-02: qty 5

## 2011-06-02 MED ORDER — OXYCODONE-ACETAMINOPHEN 5-325 MG PO TABS: 2.0000 | ORAL_TABLET | ORAL | Status: DC | PRN

## 2011-06-02 MED ORDER — FENTANYL 2.5 MCG/ML BUPIVACAINE 1/10 % EPIDURAL INFUSION (WH - ANES)
INTRAMUSCULAR | Status: AC
  Administered 2011-06-02: 14 mL/h via EPIDURAL
  Filled 2011-06-02: qty 60

## 2011-06-02 MED ORDER — FENTANYL 2.5 MCG/ML BUPIVACAINE 1/10 % EPIDURAL INFUSION (WH - ANES)
INTRAMUSCULAR | Status: DC | PRN
  Administered 2011-06-02: 14 mL/h via EPIDURAL

## 2011-06-02 MED ORDER — OXYTOCIN 20 UNITS IN LACTATED RINGERS INFUSION - SIMPLE
1.0000 m[IU]/min | INTRAVENOUS | Status: DC
  Administered 2011-06-02: 2 m[IU]/min via INTRAVENOUS
  Administered 2011-06-02: 6 m[IU]/min via INTRAVENOUS

## 2011-06-02 MED ORDER — LIDOCAINE HCL 1.5 % IJ SOLN
INTRAMUSCULAR | Status: DC | PRN
  Administered 2011-06-02 (×2): 5 mL via EPIDURAL

## 2011-06-02 MED ORDER — FLEET ENEMA 7-19 GM/118ML RE ENEM: 1.0000 | ENEMA | RECTAL | Status: DC | PRN

## 2011-06-02 MED ORDER — ONDANSETRON HCL 4 MG/2ML IJ SOLN
4.0000 mg | Freq: Four times a day (QID) | INTRAMUSCULAR | Status: DC | PRN
  Administered 2011-06-02: 4 mg via INTRAVENOUS
  Filled 2011-06-02: qty 2

## 2011-06-02 MED ORDER — EPHEDRINE 5 MG/ML INJ
INTRAVENOUS | Status: AC
  Filled 2011-06-02: qty 4

## 2011-06-02 MED ORDER — ACETAMINOPHEN 325 MG PO TABS: 650.0000 mg | ORAL_TABLET | ORAL | Status: DC | PRN

## 2011-06-02 MED ORDER — OXYTOCIN 20 UNITS IN LACTATED RINGERS INFUSION - SIMPLE
125.0000 mL/h | Freq: Once | INTRAVENOUS | Status: AC
  Administered 2011-06-02: 125 mL/h via INTRAVENOUS

## 2011-06-02 MED ORDER — CITRIC ACID-SODIUM CITRATE 334-500 MG/5ML PO SOLN: 30.0000 mL | ORAL | Status: DC | PRN

## 2011-06-02 MED ORDER — LACTATED RINGERS IV SOLN
INTRAVENOUS | Status: DC
  Administered 2011-06-02 (×2): via INTRAVENOUS

## 2011-06-02 MED ORDER — NALBUPHINE SYRINGE 5 MG/0.5 ML
5.0000 mg | INJECTION | INTRAMUSCULAR | Status: DC | PRN
  Filled 2011-06-02: qty 0.5

## 2011-06-02 MED ORDER — PHENYLEPHRINE 40 MCG/ML (10ML) SYRINGE FOR IV PUSH (FOR BLOOD PRESSURE SUPPORT)
PREFILLED_SYRINGE | INTRAVENOUS | Status: AC
  Filled 2011-06-02: qty 5

## 2011-06-02 MED ORDER — OXYTOCIN BOLUS FROM INFUSION
500.0000 mL | Freq: Once | INTRAVENOUS | Status: DC
  Filled 2011-06-02: qty 500

## 2011-06-02 MED ORDER — IBUPROFEN 600 MG PO TABS
600.0000 mg | ORAL_TABLET | Freq: Four times a day (QID) | ORAL | Status: DC | PRN
  Administered 2011-06-02: 600 mg via ORAL
  Filled 2011-06-02: qty 1

## 2011-06-02 MED ORDER — LACTATED RINGERS IV SOLN: 500.0000 mL | INTRAVENOUS | Status: DC | PRN

## 2011-06-02 MED ORDER — LACTATED RINGERS IV SOLN: 500.0000 mL | Freq: Once | INTRAVENOUS | Status: DC

## 2011-06-02 MED ORDER — LIDOCAINE HCL (PF) 1 % IJ SOLN
30.0000 mL | INTRAMUSCULAR | Status: DC | PRN
  Filled 2011-06-02 (×2): qty 30

## 2011-06-02 NOTE — Progress Notes (Signed)
Jamie Owens is a 25 y.o. G3P1102 at [redacted]w[redacted]d  Subjective: Comfortable with epid  Objective: BP 120/69  Pulse 60  Temp(Src) 98 F (36.7 C) (Oral)  Resp 20  Ht 5\' 2"  (1.575 m)  Wt 75.569 kg (166 lb 9.6 oz)  BMI 30.47 kg/m2  SpO2 98%      FHT:  FHR: 130 bpm, variability: moderate,  accelerations:  Present,  decelerations:  Absent UC:   regular, every 3-5 minutes with Pitocin 68mu/min SVE:   Dilation: 4.5 Effacement (%): 70 Station: -3 Exam by:: Currie Paris, RN AROM for clear fluid Labs: Lab Results  Component Value Date   WBC 10.8* 06/02/2011   HGB 11.0* 06/02/2011   HCT 33.6* 06/02/2011   MCV 90.8 06/02/2011   PLT 307 06/02/2011    Assessment / Plan: Latent phase  Will continue to increase Pitocin to achieve active labor Anticipate SVD   Kerrigan Glendening 06/02/2011, 5:28 PM

## 2011-06-02 NOTE — Anesthesia Preprocedure Evaluation (Signed)
Anesthesia Evaluation  Patient identified by MRN, date of birth, ID band Patient awake  General Assessment Comment  Reviewed: Allergy & Precautions, H&P , Patient's Chart, lab work & pertinent test results  Airway Mallampati: I TM Distance: >3 FB Neck ROM: full    Dental No notable dental hx.    Pulmonary    Pulmonary exam normal       Cardiovascular     Neuro/Psych Negative Neurological ROS  Negative Psych ROS   GI/Hepatic negative GI ROS Neg liver ROS    Endo/Other  Negative Endocrine ROS  Renal/GU negative Renal ROS  Genitourinary negative   Musculoskeletal negative musculoskeletal ROS (+)   Abdominal Normal abdominal exam  (+)   Peds negative pediatric ROS (+)  Hematology negative hematology ROS (+)   Anesthesia Other Findings   Reproductive/Obstetrics (+) Pregnancy                           Anesthesia Physical Anesthesia Plan  ASA: II  Anesthesia Plan: Epidural   Post-op Pain Management:    Induction:   Airway Management Planned:   Additional Equipment:   Intra-op Plan:   Post-operative Plan:   Informed Consent: I have reviewed the patients History and Physical, chart, labs and discussed the procedure including the risks, benefits and alternatives for the proposed anesthesia with the patient or authorized representative who has indicated his/her understanding and acceptance.     Plan Discussed with:   Anesthesia Plan Comments:         Anesthesia Quick Evaluation  

## 2011-06-02 NOTE — Progress Notes (Signed)
Jamie Owens is a 25 y.o. [phone] at [redacted]w[redacted]d admitted for active labor  Subjective: Patient in no pain s/p epidural, cannot feel current contractions, denies leakage of fluid or bleeding  Objective: BP 106/52  Pulse 60  Temp(Src) 98 F (36.7 C) (Oral)  Resp 18  Ht 5\' 2"  (1.575 m)  Wt 75.569 kg (166 lb 9.6 oz)  BMI 30.47 kg/m2  SpO2 98%      FHT:  FHR: 120 bpm, variability: moderate,  accelerations:  Present,  decelerations:  Absent UC:   regular, every 5-6 minutes SVE:   Dilation: 4.5 Effacement (%): 70 Station: -3 Exam by:: Currie Paris, RN  Labs: Lab Results  Component Value Date   WBC 10.8* 06/02/2011   HGB 11.0* 06/02/2011   HCT 33.6* 06/02/2011   MCV 90.8 06/02/2011   PLT 307 06/02/2011    Assessment / Plan:  Labor: start pitocin for poor progression of active phase Fetal Wellbeing:  Category I Pain Control:  Epidural Anticipated MOD:  NSVD  Brenna Friesenhahn 06/02/2011, 4:10 PM

## 2011-06-02 NOTE — Progress Notes (Signed)
Pt sent from [facility] for possible labor.  Was 3-4cm.

## 2011-06-02 NOTE — H&P (Addendum)
Jamie Owens is a 25 y.o. female, [phone] with hx of 1 delivery at 36 wks (did not receive 17-P) presenting for contractions. She was examined at [facility] earlier today and was 3-4 cm, and started to have more frequent and stronger contractions. No LOF but some spotting. Normal fetal movement. Maternal Medical History:  Reason for admission: Reason for admission: contractions.  Contractions: Onset was 6-12 hours ago.   Frequency: regular.   Duration is approximately 60 seconds.   Perceived severity is moderate.    Fetal activity: Perceived fetal activity is normal.   Last perceived fetal movement was within the past hour.    Prenatal complications: No bleeding, hypertension or infection.   Prenatal Complications - Diabetes: none.     Past Medical History  Diagnosis Date  . Preterm labor   . Urinary tract infection    Past Surgical History  Procedure Date  . Tonsillectomy    Family History: family history is not on file. Social History:  reports that she has quit smoking. She has never used smokeless tobacco. She reports that she does not drink alcohol or use illicit drugs.  Review of Systems  Constitutional: Negative.   HENT: Negative.   Eyes: Negative.   Respiratory: Negative.   Cardiovascular: Negative.   Gastrointestinal: Negative.   Genitourinary: Negative.   Musculoskeletal: Negative.   Skin: Negative.   Psychiatric/Behavioral: Negative.     Dilation: 5 Effacement (%): 70 Station: -2 Exam by:: Jamie Owens - membranes noted on exam Blood pressure 120/72, pulse 65, temperature 97 F (36.1 C), temperature source Oral, resp. rate 20, height 5\' 2"  (1.575 m), weight 75.569 kg (166 lb 9.6 oz). Maternal Exam:  Uterine Assessment: Contraction strength is moderate.  Contraction duration is 60 seconds. Contraction frequency is regular.   Abdomen: Estimated fetal weight is 7 pounds.   Fetal presentation: vertex  Introitus: Normal vulva. Normal vagina.  Vagina is negative for  discharge.  Ferning test: not done.  Nitrazine test: not done. Amniotic fluid character: not assessed.  Pelvis: adequate for delivery.   Cervix: Cervix evaluated by digital exam.     Fetal Exam Fetal Monitor Review: Mode: ultrasound.   Baseline rate: 125-140s.  Variability: moderate (6-25 bpm).   Pattern: accelerations present and no decelerations.    Fetal State Assessment: Category I - tracings are normal.     Physical Exam  Constitutional: She is oriented to person, place, and time. She appears well-developed and well-nourished. No distress.  HENT:  Head: Normocephalic.  Eyes: Pupils are equal, round, and reactive to light.  Neck: Normal range of motion.  Cardiovascular: Normal rate, regular rhythm, normal heart sounds and intact distal pulses.   No murmur heard. Respiratory: Effort normal and breath sounds normal. No respiratory distress. She has no wheezes.  GI: Soft. Bowel sounds are normal. There is no tenderness.  Genitourinary: Vagina normal. No vaginal discharge found.  Musculoskeletal: Normal range of motion.  Neurological: She is alert and oriented to person, place, and time. She has normal reflexes. She displays normal reflexes. No cranial nerve deficit.  Skin: Skin is warm and dry. No rash noted. She is not diaphoretic. No erythema.  Psychiatric: She has a normal mood and affect.    Prenatal labs: ABO, Rh: A/Positive/-- (03/15 0000) Antibody: Negative (03/15 0000) Rubella: Equivocal (03/15 0000) RPR: Nonreactive (03/15 0000)  HBsAg: Negative (03/15 0000)  HIV: Non-reactive (03/15 0000)  GBS: Negative (10/15 0000)   Assessment/Plan: Admit for active labor. Desires epidural. Plans for BTL. Will  formula feed. Will need MMR after delivery.   Jamie Owens N 06/02/2011, 11:47 AM

## 2011-06-02 NOTE — Plan of Care (Signed)
Problem: Consults Goal: Birthing Suites Patient Information Press F2 to bring up selections list  Outcome: Completed/Met Date Met:  06/02/11  Pt 37-[redacted] weeks EGA

## 2011-06-02 NOTE — Anesthesia Procedure Notes (Signed)
Epidural Patient location during procedure: OB Start time: 06/02/2011 1:34 PM End time: 06/02/2011 1:39 PM Reason for block: procedure for pain  Staffing Anesthesiologist: Sandrea Hughs Performed by: anesthesiologist   Preanesthetic Checklist Completed: patient identified, site marked, surgical consent, pre-op evaluation, timeout performed, IV checked, risks and benefits discussed and monitors and equipment checked  Epidural Patient position: sitting Prep: site prepped and draped and DuraPrep Patient monitoring: continuous pulse ox and blood pressure Approach: midline Injection technique: LOR air  Needle:  Needle type: Tuohy  Needle gauge: 17 G Needle length: 9 cm Needle insertion depth: 5 cm cm Catheter type: closed end flexible Catheter size: 19 Gauge Catheter at skin depth: 10 cm Test dose: negative and 1.5% lidocaine  Assessment Sensory level: T8 Events: blood not aspirated, injection not painful, no injection resistance, negative IV test and no paresthesia

## 2011-06-02 NOTE — Progress Notes (Signed)
Jamie Owens is a 25 y.o. [id number] at [redacted]w[redacted]d   Subjective: Beginning to feel some ctx in her abd  Objective: BP 112/69  Pulse 66  Temp(Src) 98 F (36.7 C) (Oral)  Resp 18  Ht 5\' 2"  (1.575 m)  Wt 75.569 kg (166 lb 9.6 oz)  BMI 30.47 kg/m2  SpO2 98%      FHT:  FHR: 130 bpm, variability: moderate,  accelerations:  Present,  decelerations:  Absent UC:   regular, every 2-3 minutes SVE:   6/C/-1  Labs: Lab Results  Component Value Date   WBC 10.8* 06/02/2011   HGB 11.0* 06/02/2011   HCT 33.6* 06/02/2011   MCV 90.8 06/02/2011   PLT 307 06/02/2011    Assessment / Plan: Augmentation of labor, progressing well  IUPC inserted to ensure adequate labor  Cam Hai 06/02/2011, 6:39 PM

## 2011-06-02 NOTE — Progress Notes (Signed)
Jamie Owens is a 25 y.o. G3P1102 at [redacted]w[redacted]d  Subjective: Comfortable with epid  Objective: BP 103/53  Pulse 67  Temp(Src) 98.7 F (37.1 C) (Oral)  Resp 20  Ht 5\' 2"  (1.575 m)  Wt 75.569 kg (166 lb 9.6 oz)  BMI 30.47 kg/m2  SpO2 98%      FHT:  FHR: 130 bpm, variability: moderate,  accelerations:  Present,  decelerations:  Absent UC:   regular, every 2-4 minutes with Pit at 70mu/min SVE:   9/C/0 Labs: Lab Results  Component Value Date   WBC 10.8* 06/02/2011   HGB 11.0* 06/02/2011   HCT 33.6* 06/02/2011   MCV 90.8 06/02/2011   PLT 307 06/02/2011    Assessment / Plan: Augmentation of labor, progressing well  Continue to obs  Eusebio Blazejewski 06/02/2011, 8:47 PM

## 2011-06-03 ENCOUNTER — Encounter (HOSPITAL_COMMUNITY)
Admission: AD | Disposition: A | Discharge status: Home / Self Care | Payer: Self-pay | Source: Ambulatory Visit | Attending: Obstetrics & Gynecology

## 2011-06-03 ENCOUNTER — Encounter (HOSPITAL_COMMUNITY): Payer: Self-pay | Admitting: Anesthesiology

## 2011-06-03 ENCOUNTER — Encounter (HOSPITAL_COMMUNITY): Payer: Self-pay | Admitting: *Deleted

## 2011-06-03 ENCOUNTER — Inpatient Hospital Stay (HOSPITAL_COMMUNITY): Payer: Medicaid Other | Admitting: Anesthesiology

## 2011-06-03 DIAGNOSIS — Z302 Encounter for sterilization: Secondary | ICD-10-CM

## 2011-06-03 HISTORY — PX: TUBAL LIGATION: SHX77

## 2011-06-03 SURGERY — LIGATION, FALLOPIAN TUBE, POSTPARTUM
Anesthesia: Epidural | Site: Abdomen | Laterality: Bilateral | Wound class: Clean

## 2011-06-03 MED ORDER — SUCCINYLCHOLINE CHLORIDE 20 MG/ML IJ SOLN
INTRAMUSCULAR | Status: AC
  Filled 2011-06-03: qty 1

## 2011-06-03 MED ORDER — PROPOFOL 10 MG/ML IV EMUL
INTRAVENOUS | Status: DC | PRN
  Administered 2011-06-03: 200 mg via INTRAVENOUS

## 2011-06-03 MED ORDER — LIDOCAINE-EPINEPHRINE (PF) 2 %-1:200000 IJ SOLN
INTRAMUSCULAR | Status: AC
  Filled 2011-06-03: qty 20

## 2011-06-03 MED ORDER — LANOLIN HYDROUS EX OINT: TOPICAL_OINTMENT | CUTANEOUS | Status: DC | PRN

## 2011-06-03 MED ORDER — METOCLOPRAMIDE HCL 10 MG PO TABS
10.0000 mg | ORAL_TABLET | Freq: Once | ORAL | Status: AC
  Administered 2011-06-03: 10 mg via ORAL
  Filled 2011-06-03: qty 1

## 2011-06-03 MED ORDER — IBUPROFEN 600 MG PO TABS
600.0000 mg | ORAL_TABLET | Freq: Four times a day (QID) | ORAL | Status: DC
  Administered 2011-06-03 – 2011-06-04 (×5): 600 mg via ORAL
  Filled 2011-06-03 (×5): qty 1

## 2011-06-03 MED ORDER — SUCCINYLCHOLINE CHLORIDE 20 MG/ML IJ SOLN
INTRAMUSCULAR | Status: DC | PRN
Start: 2011-06-03 — End: 2011-06-03
  Administered 2011-06-03: 100 mg via INTRAVENOUS

## 2011-06-03 MED ORDER — DIBUCAINE 1 % RE OINT: 1.0000 "application " | TOPICAL_OINTMENT | RECTAL | Status: DC | PRN

## 2011-06-03 MED ORDER — MIDAZOLAM HCL 2 MG/2ML IJ SOLN
INTRAMUSCULAR | Status: AC
  Filled 2011-06-03: qty 2

## 2011-06-03 MED ORDER — BENZOCAINE-MENTHOL 20-0.5 % EX AERO: 1.0000 "application " | INHALATION_SPRAY | CUTANEOUS | Status: DC | PRN

## 2011-06-03 MED ORDER — FAMOTIDINE 20 MG PO TABS
40.0000 mg | ORAL_TABLET | Freq: Once | ORAL | Status: AC
  Administered 2011-06-03: 40 mg via ORAL
  Filled 2011-06-03: qty 2

## 2011-06-03 MED ORDER — FENTANYL CITRATE 0.05 MG/ML IJ SOLN
INTRAMUSCULAR | Status: DC | PRN
  Administered 2011-06-03: 100 ug via INTRAVENOUS

## 2011-06-03 MED ORDER — SODIUM BICARBONATE 8.4 % IV SOLN
INTRAVENOUS | Status: AC
  Filled 2011-06-03: qty 50

## 2011-06-03 MED ORDER — FENTANYL CITRATE 0.05 MG/ML IJ SOLN
INTRAMUSCULAR | Status: AC
  Filled 2011-06-03: qty 2

## 2011-06-03 MED ORDER — LACTATED RINGERS IV SOLN
INTRAVENOUS | Status: DC
  Administered 2011-06-03 (×2): via INTRAVENOUS

## 2011-06-03 MED ORDER — SENNOSIDES-DOCUSATE SODIUM 8.6-50 MG PO TABS
2.0000 | ORAL_TABLET | Freq: Every day | ORAL | Status: DC
  Administered 2011-06-03: 2 via ORAL

## 2011-06-03 MED ORDER — BUPIVACAINE HCL (PF) 0.25 % IJ SOLN
INTRAMUSCULAR | Status: DC | PRN
  Administered 2011-06-03: 10 mL

## 2011-06-03 MED ORDER — ONDANSETRON HCL 4 MG/2ML IJ SOLN
INTRAMUSCULAR | Status: AC
  Filled 2011-06-03: qty 2

## 2011-06-03 MED ORDER — SIMETHICONE 80 MG PO CHEW: 80.0000 mg | CHEWABLE_TABLET | ORAL | Status: DC | PRN

## 2011-06-03 MED ORDER — ONDANSETRON HCL 4 MG/2ML IJ SOLN
INTRAMUSCULAR | Status: DC | PRN
  Administered 2011-06-03: 4 mg via INTRAVENOUS

## 2011-06-03 MED ORDER — TETANUS-DIPHTH-ACELL PERTUSSIS 5-2.5-18.5 LF-MCG/0.5 IM SUSP: 0.5000 mL | Freq: Once | INTRAMUSCULAR | Status: DC

## 2011-06-03 MED ORDER — LIDOCAINE HCL (CARDIAC) 20 MG/ML IV SOLN
INTRAVENOUS | Status: DC | PRN
  Administered 2011-06-03: 100 mg via INTRAVENOUS

## 2011-06-03 MED ORDER — ZOLPIDEM TARTRATE 5 MG PO TABS: 5.0000 mg | ORAL_TABLET | Freq: Every evening | ORAL | Status: DC | PRN

## 2011-06-03 MED ORDER — PRENATAL PLUS 27-1 MG PO TABS: 1.0000 | ORAL_TABLET | Freq: Every day | ORAL | Status: DC

## 2011-06-03 MED ORDER — LIDOCAINE HCL (CARDIAC) 20 MG/ML IV SOLN
INTRAVENOUS | Status: AC
  Filled 2011-06-03: qty 5

## 2011-06-03 MED ORDER — DIPHENHYDRAMINE HCL 25 MG PO CAPS: 25.0000 mg | ORAL_CAPSULE | Freq: Four times a day (QID) | ORAL | Status: DC | PRN

## 2011-06-03 MED ORDER — ONDANSETRON HCL 4 MG PO TABS: 4.0000 mg | ORAL_TABLET | ORAL | Status: DC | PRN

## 2011-06-03 MED ORDER — WITCH HAZEL-GLYCERIN EX PADS: 1.0000 "application " | MEDICATED_PAD | CUTANEOUS | Status: DC | PRN

## 2011-06-03 MED ORDER — MIDAZOLAM HCL 5 MG/5ML IJ SOLN
INTRAMUSCULAR | Status: DC | PRN
  Administered 2011-06-03: 2 mg via INTRAVENOUS

## 2011-06-03 MED ORDER — SODIUM BICARBONATE 8.4 % IV SOLN
INTRAVENOUS | Status: DC | PRN
  Administered 2011-06-03: 5 mL via EPIDURAL

## 2011-06-03 MED ORDER — ONDANSETRON HCL 4 MG/2ML IJ SOLN: 4.0000 mg | INTRAMUSCULAR | Status: DC | PRN

## 2011-06-03 MED ORDER — OXYCODONE-ACETAMINOPHEN 5-325 MG PO TABS
1.0000 | ORAL_TABLET | ORAL | Status: DC | PRN
  Administered 2011-06-03 – 2011-06-04 (×4): 1 via ORAL
  Filled 2011-06-03: qty 1
  Filled 2011-06-03: qty 2
  Filled 2011-06-03 (×2): qty 1

## 2011-06-03 MED ORDER — PROPOFOL 10 MG/ML IV EMUL
INTRAVENOUS | Status: AC
  Filled 2011-06-03: qty 20

## 2011-06-03 SURGICAL SUPPLY — 24 items
ADH SKN CLS APL DERMABOND .7 (GAUZE/BANDAGES/DRESSINGS) ×1
BLADE SURG 11 STRL SS (BLADE) ×2 IMPLANT
CATH ROBINSON RED A/P 16FR (CATHETERS) ×2 IMPLANT
CHLORAPREP W/TINT 26ML (MISCELLANEOUS) ×2 IMPLANT
CLIP FILSHIE TUBAL LIGA STRL (Clip) ×2 IMPLANT
CLOTH BEACON ORANGE TIMEOUT ST (SAFETY) ×2 IMPLANT
DERMABOND ADVANCED (GAUZE/BANDAGES/DRESSINGS) ×1
DERMABOND ADVANCED .7 DNX12 (GAUZE/BANDAGES/DRESSINGS) IMPLANT
GLOVE BIO SURGEON STRL SZ 6.5 (GLOVE) ×2 IMPLANT
GLOVE BIOGEL PI IND STRL 7.0 (GLOVE) ×2 IMPLANT
GLOVE BIOGEL PI INDICATOR 7.0 (GLOVE) ×2
GOWN PREVENTION PLUS LG XLONG (DISPOSABLE) ×5 IMPLANT
NDL HYPO 25X1 1.5 SAFETY (NEEDLE) IMPLANT
NEEDLE HYPO 25X1 1.5 SAFETY (NEEDLE) ×2 IMPLANT
NS IRRIG 1000ML POUR BTL (IV SOLUTION) ×2 IMPLANT
PACK ABDOMINAL MINOR (CUSTOM PROCEDURE TRAY) ×2 IMPLANT
SPONGE LAP 4X18 X RAY DECT (DISPOSABLE) ×1 IMPLANT
SUT VIC AB 0 CT1 27 (SUTURE) ×2
SUT VIC AB 0 CT1 27XBRD ANBCTR (SUTURE) ×1 IMPLANT
SUT VICRYL 4-0 PS2 18IN ABS (SUTURE) ×2 IMPLANT
SYR CONTROL 10ML LL (SYRINGE) ×1 IMPLANT
TOWEL OR 17X24 6PK STRL BLUE (TOWEL DISPOSABLE) ×4 IMPLANT
TRAY FOLEY BAG SILVER LF 14FR (CATHETERS) ×2 IMPLANT
WATER STERILE IRR 1000ML POUR (IV SOLUTION) ×2 IMPLANT

## 2011-06-03 NOTE — Addendum Note (Signed)
Addendum  created 06/03/11 1643 by Madison Hickman   Modules edited:Notes Section

## 2011-06-03 NOTE — Progress Notes (Signed)
Pre-op note Patient scheduled for PP sterilization, has epidural cath. The procedure and the risks of failure and ectopic pregnancy(1:200)anesthesia problems, bleeding and infection of abdominal organ damage were discussed and her questions were answered. Consent signed. CBC    Component Value Date/Time   WBC 10.8* 06/02/2011 1200   RBC 3.70* 06/02/2011 1200   HGB 11.0* 06/02/2011 1200   HCT 33.6* 06/02/2011 1200   PLT 307 06/02/2011 1200   MCV 90.8 06/02/2011 1200   MCH 29.7 06/02/2011 1200   MCHC 32.7 06/02/2011 1200   RDW 14.2 06/02/2011 1200   LYMPHSABS 2.2 11/25/2010 1150   MONOABS 0.6 11/25/2010 1150   EOSABS 0.1 11/25/2010 1150   BASOSABS 0.0 11/25/2010 1150    ARNOLD,JAMES 06/03/11

## 2011-06-03 NOTE — Progress Notes (Signed)
Post Partum Day 1 Subjective: no complaints, voiding, tolerating PO and + flatus no BM Pt is doing well, bleeding PV is decreasing and appropriate for PP day 1. She is moving around well, having no headaches, no lightheadedness, no nausea, no vomiting, no fever. Baby is doing well, bottle fed and patient will be having BTL today at 0930.  Objective: Blood pressure 101/59, pulse 66, temperature 97.9 F (36.6 C), temperature source Oral, resp. rate 20, height 5\' 2"  (1.575 m), weight 75.569 kg (166 lb 9.6 oz), SpO2 98.00%.  Physical Exam:  General: alert, cooperative, appears stated age and no distress Uterine Fundus: firm DVT Evaluation: No evidence of DVT seen on physical exam. CVS: normal rate and rhythm, S1S2 Normal Lungs: CTA bilaterally  Basename 06/02/11 1200  HGB 11.0*  HCT 33.6*    Assessment/Plan: Plan for discharge tomorrow BTL: scheduled for today at 0930. Pt NPO since midnight    LOS: 1 day   Chetan Kapat 06/03/2011, 7:53 AM

## 2011-06-03 NOTE — Progress Notes (Signed)
I have seen and examined this patient in conjunction with Chetan Kapat, PA-S.  I have taken this history and preformed the exam.  I agree with the note as written above and have made corrections as needed.   Candelaria Celeste JEHIEL 06/03/2011 11:16 AM

## 2011-06-03 NOTE — Plan of Care (Signed)
Problem: Phase II Progression Outcomes Goal: Incision intact & without signs/symptoms of infection Outcome: Completed/Met Date Met:  06/03/11 Bilateral tubal incision with skin glue

## 2011-06-03 NOTE — Op Note (Cosign Needed Addendum)
Jamie Owens 06/02/2011 - 06/03/2011  PREOPERATIVE DIAGNOSIS:  Undesired fertility  POSTOPERATIVE DIAGNOSIS:  Undesired fertility  PROCEDURE:  Postpartum Bilateral Tubal Sterilization using Filshie Clips   SURGEON: Dr Scheryl Darter  ASSISTANT:  Dr Candelaria Celeste  ANESTHESIA:  General  COMPLICATIONS:  None immediate.  ESTIMATED BLOOD LOSS:  Less than 20cc.  URINE OUTPUT:  500 mL of clear urine.  INDICATIONS: 25 y.o. yo [id number]  with undesired fertility,status post vaginal delivery, desires permanent sterilization. Risks and benefits of procedure discussed with patient including permanence of method, bleeding, infection, injury to surrounding organs and need for additional procedures. Risk failure of 0.5-1% with increased risk of ectopic gestation if pregnancy occurs was also discussed with patient.   FINDINGS:  Normal uterus, tubes, and ovaries.  TECHNIQUE:  The patient was taken to the operating room where her epidural anesthesia was dosed up to surgical level and found to be inadequate.  The patient was then anesthetized with general anesthesia.  She was then placed in the dorsal supine position and prepped and draped in sterile fashion.  After an adequate timeout was performed, attention was turned to the patient's abdomen where a small transverse skin incision was made under the umbilical fold. The incision was taken down to the layer of fascia using the scalpel, and fascia was incised, and extended bilaterally using Mayo scissors. The peritoneum was entered in a sharp fashion. Attention was then turned to the patient's uterus, and left fallopian tube was identified and followed out to the fimbriated end.  A Filshie clip was placed on the left fallopian tube about 2 cm from the cornual attachment, with care given to incorporate the underlying mesosalpinx.  A similar process was carried out on the rightl side allowing for bilateral tubal sterilization.  Good hemostasis was noted overall.   Local analgesia was drizzled on both operative sites.The instruments were then removed from the patient's abdomen and the fascial incision was repaired with 0 Vicryl, and the skin was closed with a 3-0 Monocryl subcuticular stitch. The patient tolerated the procedure well.  Sponge, lap, and needle counts were correct times two.  The patient was then taken to the recovery room awake, extubated and in stable condition.  Candelaria Celeste JEHIEL 10:52 AM 06/03/2011

## 2011-06-03 NOTE — Anesthesia Postprocedure Evaluation (Signed)
  Anesthesia Post-op Note  Patient: Jamie Owens  Procedure(s) Performed:  POST PARTUM TUBAL LIGATION  Patient Location: PACU  Anesthesia Type: General and Epidural  Level of Consciousness: awake, alert  and oriented  Airway and Oxygen Therapy: Patient Spontanous Breathing  Post-op Pain: none  Post-op Assessment: Post-op Vital signs reviewed, Patient's Cardiovascular Status Stable, Respiratory Function Stable, Patent Airway, No signs of Nausea or vomiting and Pain level controlled  Post-op Vital Signs: Reviewed and stable  Complications: No apparent anesthesia complications

## 2011-06-03 NOTE — Preoperative (Signed)
Beta Blockers   Reason not to administer Beta Blockers:Not Applicable 

## 2011-06-03 NOTE — Transfer of Care (Signed)
Immediate Anesthesia Transfer of Care Note  Patient: Jamie Owens  Procedure(s) Performed:  POST PARTUM TUBAL LIGATION  Patient Location: PACU  Anesthesia Type: General  Level of Consciousness: awake, alert , oriented and patient cooperative  Airway & Oxygen Therapy: Patient Spontanous Breathing and Patient connected to nasal cannula oxygen  Post-op Assessment: Report given to PACU RN and Post -op Vital signs reviewed and stable  Post vital signs: Reviewed and stable  Complications: No apparent anesthesia complications

## 2011-06-03 NOTE — Progress Notes (Signed)
UR chart review completed.  

## 2011-06-03 NOTE — Anesthesia Postprocedure Evaluation (Signed)
  Anesthesia Post-op Note  Patient: Jamie Owens  Procedure(s) Performed:  POST PARTUM TUBAL LIGATION  Patient Location: Mother/Baby  Anesthesia Type: General  Level of Consciousness: awake, alert  and oriented  Airway and Oxygen Therapy: Patient Spontanous Breathing  Post-op Pain: none  Post-op Assessment: Post-op Vital signs reviewed and Patient's Cardiovascular Status Stable  Post-op Vital Signs: Reviewed and stable  Complications: No apparent anesthesia complications

## 2011-06-03 NOTE — Anesthesia Preprocedure Evaluation (Signed)
Anesthesia Evaluation  Patient identified by MRN, date of birth, ID band Patient awake  General Assessment Comment  Reviewed: Allergy & Precautions, H&P , Patient's Chart, lab work & pertinent test results  Airway Mallampati: I TM Distance: >3 FB Neck ROM: full    Dental No notable dental hx. (+) Teeth Intact and Loose,    Pulmonary  clear to auscultation  Pulmonary exam normal       Cardiovascular Regular Normal    Neuro/Psych Negative Neurological ROS  Negative Psych ROS   GI/Hepatic negative GI ROS Neg liver ROS    Endo/Other  Negative Endocrine ROS  Renal/GU negative Renal ROS  Genitourinary negative   Musculoskeletal negative musculoskeletal ROS (+)   Abdominal Normal abdominal exam  (+)   Peds negative pediatric ROS (+)  Hematology negative hematology ROS (+)   Anesthesia Other Findings   Reproductive/Obstetrics negative OB ROS                           Anesthesia Physical  Anesthesia Plan  ASA: II  Anesthesia Plan: Epidural   Post-op Pain Management:    Induction:   Airway Management Planned:   Additional Equipment:   Intra-op Plan:   Post-operative Plan:   Informed Consent: I have reviewed the patients History and Physical, chart, labs and discussed the procedure including the risks, benefits and alternatives for the proposed anesthesia with the patient or authorized representative who has indicated his/her understanding and acceptance.     Plan Discussed with: CRNA, Anesthesiologist and Surgeon  Anesthesia Plan Comments:         Anesthesia Quick Evaluation

## 2011-06-03 NOTE — Anesthesia Postprocedure Evaluation (Signed)
Anesthesia Post Note  Patient: Jamie Owens  Procedure(s) Performed: * No procedures listed *  Anesthesia type: Epidural  Patient location: Mother/Baby  Post pain: Pain level controlled  Post assessment: Post-op Vital signs reviewed  Last Vitals:  Filed Vitals:   06/03/11 0740  BP: 101/59  Pulse: 66  Temp: 36.6 C  Resp: 20    Post vital signs: Reviewed  Level of consciousness: awake  Complications: No apparent anesthesia complications

## 2011-06-04 ENCOUNTER — Encounter (HOSPITAL_COMMUNITY): Payer: Self-pay | Admitting: Obstetrics & Gynecology

## 2011-06-04 MED ORDER — OXYCODONE-ACETAMINOPHEN 5-325 MG PO TABS: 1.0000 | ORAL_TABLET | ORAL | Status: AC | PRN

## 2011-06-04 NOTE — Discharge Summary (Signed)
Obstetric Discharge Summary Reason for Admission: onset of labor Prenatal Procedures: none Intrapartum Procedures: spontaneous vaginal delivery Postpartum Procedures: P.P. tubal ligation Complications-Operative and Postpartum: none Hemoglobin  Date Value Range Status  06/02/2011 11.0* 12.0-15.0 (g/dL) Final     HCT  Date Value Range Status  06/02/2011 33.6* 36.0-46.0 (%) Final    Discharge Diagnoses: Term Pregnancy-delivered  Discharge Information: Date: 06/04/2011 Activity: pelvic rest Diet: routine Medications: PNV, Ibuprofen and Percocet Condition: stable Instructions: refer to practice specific booklet Discharge to: home Follow-up Information    Follow up with [facility]. Make an appointment in 6 weeks. (for postpartum visit)          Newborn Data: Live born female  Birth Weight: 6 lb 14.9 oz (3144 g) APGAR: 9, 9  Home with mother.  Jamie Owens 06/04/2011, 8:26 AM

## 2011-06-04 NOTE — Progress Notes (Signed)
Post Partum Day #2, POD#1 from BTL Subjective: no complaints, up ad lib, voiding and tolerating PO, pain well controlled.  Has incisional pain from BTL, controlled with percocet.  Lochia normal.    Objective: Blood pressure 113/78, pulse 58, temperature 98 F (36.7 C), temperature source Oral, resp. rate 18, height 5\' 2"  (1.575 m), weight 75.569 kg (166 lb 9.6 oz), SpO2 98.00%.  Physical Exam:  General: alert, cooperative and no distress Heart: regular rate, no murmur Lungs: clear to auscultation bilaterally, no wheezing. Lochia: appropriate Uterine Fundus: firm Incision: healing well DVT Evaluation: No cords or calf tenderness. No significant calf/ankle edema.   Basename 06/02/11 1200  HGB 11.0*  HCT 33.6*    Assessment/Plan: Discharge home.  Percocet #20 given.  Follow up in 6 weeks.   LOS: 2 days   Jamie Owens JEHIEL 06/04/2011, 6:45 AM

## 2013-12-10 ENCOUNTER — Emergency Department (HOSPITAL_BASED_OUTPATIENT_CLINIC_OR_DEPARTMENT_OTHER)
Admission: EM | Admit: 2013-12-10 | Discharge: 2013-12-10 | Disposition: A | Discharge status: Home / Self Care | Payer: Medicaid Other | Attending: Emergency Medicine | Admitting: Emergency Medicine

## 2013-12-10 ENCOUNTER — Encounter (HOSPITAL_BASED_OUTPATIENT_CLINIC_OR_DEPARTMENT_OTHER): Payer: Self-pay | Admitting: Emergency Medicine

## 2013-12-10 DIAGNOSIS — F17200 Nicotine dependence, unspecified, uncomplicated: Secondary | ICD-10-CM | POA: Insufficient documentation

## 2013-12-10 DIAGNOSIS — K089 Disorder of teeth and supporting structures, unspecified: Secondary | ICD-10-CM | POA: Insufficient documentation

## 2013-12-10 DIAGNOSIS — Z87440 Personal history of urinary (tract) infections: Secondary | ICD-10-CM | POA: Insufficient documentation

## 2013-12-10 DIAGNOSIS — K029 Dental caries, unspecified: Secondary | ICD-10-CM | POA: Insufficient documentation

## 2013-12-10 DIAGNOSIS — Z79899 Other long term (current) drug therapy: Secondary | ICD-10-CM | POA: Insufficient documentation

## 2013-12-10 MED ORDER — CLINDAMYCIN HCL 150 MG PO CAPS: 150.0000 mg | ORAL_CAPSULE | Freq: Three times a day (TID) | ORAL | Status: DC

## 2013-12-10 MED ORDER — HYDROCODONE-ACETAMINOPHEN 5-325 MG PO TABS
ORAL_TABLET | ORAL | Status: AC
  Filled 2013-12-10: qty 1

## 2013-12-10 MED ORDER — HYDROCODONE-ACETAMINOPHEN 5-325 MG PO TABS
1.0000 | ORAL_TABLET | Freq: Once | ORAL | Status: AC
  Administered 2013-12-10: 1 via ORAL

## 2013-12-10 MED ORDER — HYDROCODONE-ACETAMINOPHEN 5-325 MG PO TABS: 1.0000 | ORAL_TABLET | Freq: Four times a day (QID) | ORAL | Status: DC | PRN

## 2013-12-10 MED ORDER — BUPIVACAINE-EPINEPHRINE (PF) 0.5% -1:200000 IJ SOLN
1.8000 mL | Freq: Once | INTRAMUSCULAR | Status: AC
Start: 2013-12-10 — End: 2013-12-10
  Administered 2013-12-10: 1.8 mL
  Filled 2013-12-10: qty 1.8

## 2013-12-10 NOTE — ED Notes (Signed)
rx x 2 for norco and clindamycin- d/c home with family to drive

## 2013-12-10 NOTE — ED Notes (Signed)
C/o right upper tooth pain/jaw pain that started 3 days ago but states worse today. Denies a fever. States she has several known "bad" teeth. Pt does not have a dentist.

## 2013-12-10 NOTE — Discharge Instructions (Signed)

## 2013-12-10 NOTE — ED Provider Notes (Signed)
CSN: [id number]     Arrival date & time 12/10/13  0059 History   First MD Initiated Contact with Patient 12/10/13 0110     Chief Complaint  Patient presents with  . Dental Pain     (Consider location/radiation/quality/duration/timing/severity/associated sxs/prior Treatment) HPI Is a 28 year old female with a history of dental caries. She is here with a 2 to three-day history of pain associated with her right upper first and second molars. The pain has steadily worsened and is now moderate to severe. Pain is worse with eating or drinking. She has not gotten relief with Tylenol. She's been taking her mother's amoxicillin. There is no associated cervical lymphadenopathy. The pain radiates up into her right maxilla.  Past Medical History  Diagnosis Date  . Preterm labor   . Urinary tract infection    Past Surgical History  Procedure Laterality Date  . Tonsillectomy    . Tubal ligation  06/03/2011    Procedure: POST PARTUM TUBAL LIGATION;  Surgeon: Scheryl DarterJames Arnold, MD;  Location: [facility];  Service: Gynecology;  Laterality: Bilateral;   No family history on file. History  Substance Use Topics  . Smoking status: Current Every Day Smoker  . Smokeless tobacco: Never Used  . Alcohol Use: No   OB History   Grav Para Term Preterm Abortions TAB SAB Ect Mult Living   3 2 1 1      2      Review of Systems  All other systems reviewed and are negative.   Allergies  Review of patient's allergies indicates no known allergies.  Home Medications   Prior to Admission medications   Medication Sig Start Date End Date Taking? Authorizing Provider  guaifenesin (ROBITUSSIN) 100 MG/5ML syrup Take 200 mg by mouth 3 (three) times daily as needed. cough     Historical Provider, MD  Pediatric Multivit-Minerals-C (GUMMI BEAR MULTIVITAMIN/MIN) CHEW Chew 2 tablets by mouth daily.      Historical Provider, MD   BP 106/66  Pulse 70  Temp(Src) 98.7 F (37.1 C) (Oral)  Resp 18  Ht 5\' 4"  (1.626 m)  Wt 148  lb (67.132 kg)  BMI 25.39 kg/m2  SpO2 99%  LMP 11/19/2013  Breastfeeding? Unknown  Physical Exam General: Well-developed, well-nourished female in no acute distress; appearance consistent with age of record HENT: normocephalic; atraumatic; dental caries, notably carious right upper first and second molars with tenderness to percussion Eyes: pupils equal, round and reactive to light; extraocular muscles intact Neck: supple; no lymphadenopathy Heart: regular rate and rhythm Lungs: clear to auscultation bilaterally Abdomen: soft; nondistended; nontender; no masses or hepatosplenomegaly; bowel sounds present Extremities: No deformity; full range of motion Neurologic: Awake, alert and oriented; motor function intact in all extremities and symmetric; no facial droop Skin: Warm and dry Psychiatric: Mildly anxious    ED Course  Procedures (including critical care time)  DENTAL BLOCK 1.8 mL of 0.5% bupivacaine with epinephrine were injected into the buccal fold adjacent to the right upper first and second molars. Adequate analgesia was obtained  MDM      Hanley SeamenJohn L Mihcael Ledee, MD 12/10/13 0130

## 2014-06-04 ENCOUNTER — Encounter (HOSPITAL_BASED_OUTPATIENT_CLINIC_OR_DEPARTMENT_OTHER): Payer: Self-pay | Admitting: Emergency Medicine

## 2014-12-17 ENCOUNTER — Emergency Department (HOSPITAL_BASED_OUTPATIENT_CLINIC_OR_DEPARTMENT_OTHER)
Admission: EM | Admit: 2014-12-17 | Discharge: 2014-12-17 | Disposition: A | Discharge status: Home / Self Care | Payer: Medicaid Other | Attending: Emergency Medicine | Admitting: Emergency Medicine

## 2014-12-17 ENCOUNTER — Encounter (HOSPITAL_BASED_OUTPATIENT_CLINIC_OR_DEPARTMENT_OTHER): Payer: Self-pay | Admitting: *Deleted

## 2014-12-17 DIAGNOSIS — Z79899 Other long term (current) drug therapy: Secondary | ICD-10-CM | POA: Insufficient documentation

## 2014-12-17 DIAGNOSIS — G8929 Other chronic pain: Secondary | ICD-10-CM | POA: Insufficient documentation

## 2014-12-17 DIAGNOSIS — Z3202 Encounter for pregnancy test, result negative: Secondary | ICD-10-CM | POA: Insufficient documentation

## 2014-12-17 DIAGNOSIS — Z720 Tobacco use: Secondary | ICD-10-CM | POA: Insufficient documentation

## 2014-12-17 DIAGNOSIS — Z792 Long term (current) use of antibiotics: Secondary | ICD-10-CM | POA: Insufficient documentation

## 2014-12-17 DIAGNOSIS — N390 Urinary tract infection, site not specified: Secondary | ICD-10-CM

## 2014-12-17 DIAGNOSIS — R102 Pelvic and perineal pain: Secondary | ICD-10-CM | POA: Insufficient documentation

## 2014-12-17 LAB — URINALYSIS, ROUTINE W REFLEX MICROSCOPIC
Bilirubin Urine: NEGATIVE
Glucose, UA: NEGATIVE mg/dL
Ketones, ur: NEGATIVE mg/dL
NITRITE: NEGATIVE
PROTEIN: NEGATIVE mg/dL
SPECIFIC GRAVITY, URINE: 1.011 (ref 1.005–1.030)
UROBILINOGEN UA: 0.2 mg/dL (ref 0.0–1.0)
pH: 5.5 (ref 5.0–8.0)

## 2014-12-17 LAB — WET PREP, GENITAL
Trich, Wet Prep: NONE SEEN
YEAST WET PREP: NONE SEEN

## 2014-12-17 LAB — URINE MICROSCOPIC-ADD ON

## 2014-12-17 LAB — PREGNANCY, URINE: Preg Test, Ur: NEGATIVE

## 2014-12-17 MED ORDER — IBUPROFEN 800 MG PO TABS
800.0000 mg | ORAL_TABLET | Freq: Once | ORAL | Status: AC
  Administered 2014-12-17: 800 mg via ORAL
  Filled 2014-12-17: qty 1

## 2014-12-17 MED ORDER — IBUPROFEN 600 MG PO TABS: 600.0000 mg | ORAL_TABLET | Freq: Four times a day (QID) | ORAL | Status: DC | PRN

## 2014-12-17 MED ORDER — SULFAMETHOXAZOLE-TRIMETHOPRIM 800-160 MG PO TABS
1.0000 | ORAL_TABLET | Freq: Once | ORAL | Status: AC
  Administered 2014-12-17: 1 via ORAL
  Filled 2014-12-17: qty 1

## 2014-12-17 MED ORDER — SULFAMETHOXAZOLE-TRIMETHOPRIM 800-160 MG PO TABS: 1.0000 | ORAL_TABLET | Freq: Two times a day (BID) | ORAL | Status: DC

## 2014-12-17 MED ORDER — OMEPRAZOLE 20 MG PO CPDR: 20.0000 mg | DELAYED_RELEASE_CAPSULE | Freq: Every day | ORAL | Status: DC

## 2014-12-17 MED ORDER — SULFAMETHOXAZOLE-TRIMETHOPRIM 800-160 MG PO TABS: 1.0000 | ORAL_TABLET | Freq: Two times a day (BID) | ORAL | Status: AC

## 2014-12-17 NOTE — ED Notes (Signed)
Pt c/o lower abd pain x 12 hrs denies n/v

## 2014-12-17 NOTE — Discharge Instructions (Signed)
Urinary Tract Infection °Urinary tract infections (UTIs) can develop anywhere along your urinary tract. Your urinary tract is your body's drainage system for removing wastes and extra water. Your urinary tract includes two kidneys, two ureters, a bladder, and a urethra. Your kidneys are a pair of bean-shaped organs. Each kidney is about the size of your fist. They are located below your ribs, one on each side of your spine. °CAUSES °Infections are caused by microbes, which are microscopic organisms, including fungi, viruses, and bacteria. These organisms are so small that they can only be seen through a microscope. Bacteria are the microbes that most commonly cause UTIs. °SYMPTOMS  °Symptoms of UTIs may vary by age and gender of the patient and by the location of the infection. Symptoms in young women typically include a frequent and intense urge to urinate and a painful, burning feeling in the bladder or urethra during urination. Older women and men are more likely to be tired, shaky, and weak and have muscle aches and abdominal pain. A fever may mean the infection is in your kidneys. Other symptoms of a kidney infection include pain in your back or sides below the ribs, nausea, and vomiting. °DIAGNOSIS °To diagnose a UTI, your caregiver will ask you about your symptoms. Your caregiver also will ask to provide a urine sample. The urine sample will be tested for bacteria and white blood cells. White blood cells are made by your body to help fight infection. °TREATMENT  °Typically, UTIs can be treated with medication. Because most UTIs are caused by a bacterial infection, they usually can be treated with the use of antibiotics. The choice of antibiotic and length of treatment depend on your symptoms and the type of bacteria causing your infection. °HOME CARE INSTRUCTIONS °· If you were prescribed antibiotics, take them exactly as your caregiver instructs you. Finish the medication even if you feel better after you  have only taken some of the medication. °· Drink enough water and fluids to keep your urine clear or pale yellow. °· Avoid caffeine, tea, and carbonated beverages. They tend to irritate your bladder. °· Empty your bladder often. Avoid holding urine for long periods of time. °· Empty your bladder before and after sexual intercourse. °· After a bowel movement, women should cleanse from front to back. Use each tissue only once. °SEEK MEDICAL CARE IF:  °· You have back pain. °· You develop a fever. °· Your symptoms do not begin to resolve within 3 days. °SEEK IMMEDIATE MEDICAL CARE IF:  °· You have severe back pain or lower abdominal pain. °· You develop chills. °· You have nausea or vomiting. °· You have continued burning or discomfort with urination. °MAKE SURE YOU:  °· Understand these instructions. °· Will watch your condition. °· Will get help right away if you are not doing well or get worse. °Document Released: 04/29/2005 Document Revised: 01/19/2012 Document Reviewed: 08/28/2011 °Exit[employer]® Patient Information ©2015 [employer]. This information is not intended to replace advice given to you by your health care provider. Make sure you discuss any questions you have with your health care provider. ° ° °Abdominal Pain, Women °Abdominal (stomach, pelvic, or belly) pain can be caused by many things. It is important to tell your doctor: °· The location of the pain. °· Does it come and go or is it present all the time? °· Are there things that start the pain (eating certain foods, exercise)? °· Are there other symptoms associated with the pain (fever, nausea, vomiting,   diarrhea)? °All of this is helpful to know when trying to find the cause of the pain. °CAUSES  °· Stomach: virus or bacteria infection, or ulcer. °· Intestine: appendicitis (inflamed appendix), regional ileitis (Crohn's disease), ulcerative colitis (inflamed colon), irritable bowel syndrome, diverticulitis (inflamed diverticulum of the colon), or  cancer of the stomach or intestine. °· Gallbladder disease or stones in the gallbladder. °· Kidney disease, kidney stones, or infection. °· Pancreas infection or cancer. °· Fibromyalgia (pain disorder). °· Diseases of the female organs: °· Uterus: fibroid (non-cancerous) tumors or infection. °· Fallopian tubes: infection or tubal pregnancy. °· Ovary: cysts or tumors. °· Pelvic adhesions (scar tissue). °· Endometriosis (uterus lining tissue growing in the pelvis and on the pelvic organs). °· Pelvic congestion syndrome (female organs filling up with blood just before the menstrual period). °· Pain with the menstrual period. °· Pain with ovulation (producing an egg). °· Pain with an IUD (intrauterine device, birth control) in the uterus. °· Cancer of the female organs. °· Functional pain (pain not caused by a disease, may improve without treatment). °· Psychological pain. °· Depression. °DIAGNOSIS  °Your doctor will decide the seriousness of your pain by doing an examination. °· Blood tests. °· X-rays. °· Ultrasound. °· CT scan (computed tomography, special type of X-ray). °· MRI (magnetic resonance imaging). °· Cultures, for infection. °· Barium enema (dye inserted in the large intestine, to better view it with X-rays). °· Colonoscopy (looking in intestine with a lighted tube). °· Laparoscopy (minor surgery, looking in abdomen with a lighted tube). °· Major abdominal exploratory surgery (looking in abdomen with a large incision). °TREATMENT  °The treatment will depend on the cause of the pain.  °· Many cases can be observed and treated at home. °· Over-the-counter medicines recommended by your caregiver. °· Prescription medicine. °· Antibiotics, for infection. °· Birth control pills, for painful periods or for ovulation pain. °· Hormone treatment, for endometriosis. °· Nerve blocking injections. °· Physical therapy. °· Antidepressants. °· Counseling with a psychologist or psychiatrist. °· Minor or major surgery. °HOME  CARE INSTRUCTIONS  °· Do not take laxatives, unless directed by your caregiver. °· Take over-the-counter pain medicine only if ordered by your caregiver. Do not take aspirin because it can cause an upset stomach or bleeding. °· Try a clear liquid diet (broth or water) as ordered by your caregiver. Slowly move to a bland diet, as tolerated, if the pain is related to the stomach or intestine. °· Have a thermometer and take your temperature several times a day, and record it. °· Bed rest and sleep, if it helps the pain. °· Avoid sexual intercourse, if it causes pain. °· Avoid stressful situations. °· Keep your follow-up appointments and tests, as your caregiver orders. °· If the pain does not go away with medicine or surgery, you may try: °¨ Acupuncture. °¨ Relaxation exercises (yoga, meditation). °¨ Group therapy. °¨ Counseling. °SEEK MEDICAL CARE IF:  °· You notice certain foods cause stomach pain. °· Your home care treatment is not helping your pain. °· You need stronger pain medicine. °· You want your IUD removed. °· You feel faint or lightheaded. °· You develop nausea and vomiting. °· You develop a rash. °· You are having side effects or an allergy to your medicine. °SEEK IMMEDIATE MEDICAL CARE IF:  °· Your pain does not go away or gets worse. °· You have a fever. °· Your pain is felt only in portions of the abdomen. The right side could possibly be appendicitis. The   left lower portion of the abdomen could be colitis or diverticulitis. °· You are passing blood in your stools (bright red or black tarry stools, with or without vomiting). °· You have blood in your urine. °· You develop chills, with or without a fever. °· You pass out. °MAKE SURE YOU:  °· Understand these instructions. °· Will watch your condition. °· Will get help right away if you are not doing well or get worse. °Document Released: 05/17/2007 Document Revised: 12/04/2013 Document Reviewed: 06/06/2009 °ExitCare® Patient Information ©2015 [employer]. This information is not intended to replace advice given to you by your health care provider. Make sure you discuss any questions you have with your health care provider. ° °

## 2014-12-17 NOTE — ED Provider Notes (Signed)
CSN: [id number]     Arrival date & time 12/17/14  1828 History  This chart was scribed for Arby BarretteMarcy Devlin Mcveigh, MD by Abel PrestoKara Demonbreun, ED Scribe. This patient was seen in room MH11/MH11 and the patient's care was started at 9:23 PM.     Chief Complaint  Patient presents with  . Abdominal Pain    The history is provided by the patient. No language interpreter was used.   HPI Comments: Jamie Owens is a 29 y.o. female who presents to the Emergency Department complaining of cramping suprapubic pain with onset at 4 AM. Pt reports associated syncopal episode. Pt denies any aggravating or alleviating factors. She reports h/o tubal ligation and chronic upper abdominal pain due to GERD. Pt denies bilateral LE swelling, rash, vaginal bleeding, new vaginal discharge, cough, cold symptoms, urinary frequency, dysuria, nausea, and vomiting.  The patient reports that she is in a monogamous relationship and does not suspect exposure to sexually transmitted disease. She reports that yesterday evening she was having cramping lower abdominal pain and going in and out of the bathroom. She reports that somewhere during the night if she was doing that she collapsed onto the floor and her husband found her. At the time she was having severe lower abdominal pain which now is uncomfortable but not as severe as it was yesterday evening.  Past Medical History  Diagnosis Date  . Preterm labor   . Urinary tract infection    Past Surgical History  Procedure Laterality Date  . Tonsillectomy    . Tubal ligation  06/03/2011    Procedure: POST PARTUM TUBAL LIGATION;  Surgeon: Scheryl DarterJames Arnold, MD;  Location: [facility];  Service: Gynecology;  Laterality: Bilateral;   History reviewed. No pertinent family history. History  Substance Use Topics  . Smoking status: Current Every Day Smoker    Types: Cigarettes  . Smokeless tobacco: Never Used  . Alcohol Use: No   OB History    Gravida Para Term Preterm AB TAB SAB Ectopic Multiple  Living   3 2 1 1      2      Review of Systems  Gastrointestinal: Positive for abdominal pain.   10 Systems reviewed and all are negative for acute change except as noted in the HPI.     Allergies  Review of patient's allergies indicates no known allergies.  Home Medications   Prior to Admission medications   Medication Sig Start Date End Date Taking? Authorizing Provider  clindamycin (CLEOCIN) 150 MG capsule Take 1 capsule (150 mg total) by mouth 3 (three) times daily. 12/10/13   John Molpus, MD  guaifenesin (ROBITUSSIN) 100 MG/5ML syrup Take 200 mg by mouth 3 (three) times daily as needed. cough     Historical Provider, MD  HYDROcodone-acetaminophen (NORCO/VICODIN) 5-325 MG per tablet Take 1-2 tablets by mouth every 6 (six) hours as needed for moderate pain. 12/10/13   John Molpus, MD  ibuprofen (ADVIL,MOTRIN) 600 MG tablet Take 1 tablet (600 mg total) by mouth every 6 (six) hours as needed. 12/17/14   Arby BarretteMarcy Izzabella Besse, MD  omeprazole (PRILOSEC) 20 MG capsule Take 1 capsule (20 mg total) by mouth daily. 12/17/14   Arby BarretteMarcy Monika Chestang, MD  Pediatric Multivit-Minerals-C (GUMMI BEAR MULTIVITAMIN/MIN) CHEW Chew 2 tablets by mouth daily.      Historical Provider, MD   BP 120/65 mmHg  Pulse 63  Temp(Src) 98 F (36.7 C) (Oral)  Resp 18  Wt 160 lb (72.576 kg)  SpO2 100%  LMP 12/03/2014 Physical Exam  Constitutional: She is oriented to person, place, and time. She appears well-developed and well-nourished.  HENT:  Head: Normocephalic and atraumatic.  Eyes: EOM are normal. Pupils are equal, round, and reactive to light.  Neck: Neck supple.  Cardiovascular: Normal rate, regular rhythm, normal heart sounds and intact distal pulses.   Pulmonary/Chest: Effort normal and breath sounds normal.  Abdominal: Soft. Bowel sounds are normal. She exhibits no distension. There is tenderness.  Patient with suprapubic tenderness to palpation. There is no guarding present. No mass.  Genitourinary:  Normal  external female genitalia. Echo examination moderate amount of whitish discharge in the vaginal vault. Cervix is not friable. There is no bleeding. There is no discharge from the cervix. Bimanual examination is for uterine tenderness but no enlargement. Adnexa did not have mass.  Musculoskeletal: Normal range of motion. She exhibits no edema.  Neurological: She is alert and oriented to person, place, and time. She has normal strength. Coordination normal. GCS eye subscore is 4. GCS verbal subscore is 5. GCS motor subscore is 6.  Skin: Skin is warm, dry and intact.  Psychiatric: She has a normal mood and affect.    ED Course  Procedures (including critical care time) DIAGNOSTIC STUDIES: Oxygen Saturation is 100% on room air, normal by my interpretation.    COORDINATION OF CARE: 9:27 PM Discussed treatment plan with patient at beside, the patient agrees with the plan and has no further questions at this time.   Labs Review Labs Reviewed  WET PREP, GENITAL - Abnormal; Notable for the following:    Clue Cells Wet Prep HPF POC FEW (*)    WBC, Wet Prep HPF POC MODERATE (*)    All other components within normal limits  URINALYSIS, ROUTINE W REFLEX MICROSCOPIC - Abnormal; Notable for the following:    APPearance CLOUDY (*)    Hgb urine dipstick SMALL (*)    Leukocytes, UA MODERATE (*)    All other components within normal limits  URINE MICROSCOPIC-ADD ON - Abnormal; Notable for the following:    Bacteria, UA MANY (*)    All other components within normal limits  PREGNANCY, URINE  GC/CHLAMYDIA PROBE AMP ([place])    Imaging Review No results found.   EKG Interpretation None      MDM   Final diagnoses:  Pelvic pain in female   The patient reports pelvic pain without abnormal discharge or bleeding. She did have some concern for pregnancy stating that she notes intermittently spasming type of feeling that feels like a baby kicking her. The patient has had bilateral tubal  ligation. Her pain is very localized to the suprapubic/uterine area. By examination there is not discharge or friability to suggest cervicitis. Patient denies likely wrist to STD. At this time she will not be empirically treated on antibiotics based on findings.   Arby BarretteMarcy Opaline Reyburn, MD 12/23/14 1434

## 2014-12-18 LAB — GC/CHLAMYDIA PROBE AMP (~~LOC~~) NOT AT ARMC
CHLAMYDIA, DNA PROBE: NEGATIVE
NEISSERIA GONORRHEA: NEGATIVE

## 2014-12-19 LAB — URINE CULTURE

## 2015-10-31 ENCOUNTER — Encounter (HOSPITAL_BASED_OUTPATIENT_CLINIC_OR_DEPARTMENT_OTHER): Payer: Self-pay | Admitting: Emergency Medicine

## 2015-10-31 ENCOUNTER — Emergency Department (HOSPITAL_BASED_OUTPATIENT_CLINIC_OR_DEPARTMENT_OTHER)
Admission: EM | Admit: 2015-10-31 | Discharge: 2015-10-31 | Disposition: A | Discharge status: Home / Self Care | Payer: Medicaid Other | Attending: Emergency Medicine | Admitting: Emergency Medicine

## 2015-10-31 DIAGNOSIS — K029 Dental caries, unspecified: Secondary | ICD-10-CM | POA: Insufficient documentation

## 2015-10-31 DIAGNOSIS — Z87440 Personal history of urinary (tract) infections: Secondary | ICD-10-CM | POA: Insufficient documentation

## 2015-10-31 DIAGNOSIS — Z79899 Other long term (current) drug therapy: Secondary | ICD-10-CM | POA: Insufficient documentation

## 2015-10-31 DIAGNOSIS — Z87891 Personal history of nicotine dependence: Secondary | ICD-10-CM | POA: Insufficient documentation

## 2015-10-31 DIAGNOSIS — K047 Periapical abscess without sinus: Secondary | ICD-10-CM | POA: Insufficient documentation

## 2015-10-31 DIAGNOSIS — J029 Acute pharyngitis, unspecified: Secondary | ICD-10-CM

## 2015-10-31 MED ORDER — PENICILLIN V POTASSIUM 500 MG PO TABS: 500.0000 mg | ORAL_TABLET | Freq: Four times a day (QID) | ORAL | Status: AC

## 2015-10-31 MED ORDER — IBUPROFEN 800 MG PO TABS: 800.0000 mg | ORAL_TABLET | Freq: Three times a day (TID) | ORAL | Status: DC | PRN

## 2015-10-31 MED FILL — IBUPROFEN 800 MG TABLET: 800 | 7 days supply | Qty: 21 | Fill #0

## 2015-10-31 MED FILL — PENICILLIN VK 500 MG TABLET: 500 | 10 days supply | Qty: 40 | Fill #0

## 2015-10-31 NOTE — ED Notes (Signed)
Throat Pain x2 days.  Sts she did have dental pain from a broken molar on right upper, but the tooth has eased off and now her throat is hurting, worse on the right. Pt concerned that an infection that started in the tooth may now be affecting her throat.  Has upcoming dental appt.

## 2015-10-31 NOTE — Discharge Instructions (Signed)
Read the information below.  Use the prescribed medication as directed.  Please discuss all new medications with your pharmacist.  You may return to the Emergency Department at any time for worsening condition or any new symptoms that concern you.    Please call the dentist listed above within 48 hours to schedule a close follow up appointment.  If you develop fevers, swelling in your face, difficulty swallowing or breathing, return to the ER immediately for a recheck.     Dental Pain Dental pain may be caused by many things, including:  Tooth decay (cavities or caries). Cavities cause the nerve of your tooth to be open to air and hot or cold temperatures. This can cause pain or discomfort.  Abscess or infection. A dental abscess is an area that is full of infected pus from a bacterial infection in the inner part of the tooth (pulp). It usually happens at the end of the tooth's root.  Injury.  An unknown reason (idiopathic). Your pain may be mild or severe. It may only happen when:  You are chewing.  You are exposed to hot or cold temperature.  You are eating or drinking sugary foods or beverages, such as:  Soda.  Candy. Your pain may also be there all of the time. HOME CARE Watch your dental pain for any changes. Do these things to lessen your discomfort:  Take medicines only as told by your dentist.  If your dentist tells you to take an antibiotic medicine, finish all of it even if you start to feel better.  Keep all follow-up visits as told by your dentist. This is important.  Do not apply heat to the outside of your face.  Rinse your mouth or gargle with salt water if told by your dentist. This helps with pain and swelling.  You can make salt water by adding  tsp of salt to 1 cup of warm water.  Apply ice to the painful area of your face:  Put ice in a plastic bag.  Place a towel between your skin and the bag.  Leave the ice on for 20 minutes, 2-3 times per  day.  Avoid foods or drinks that cause you pain, such as:  Very hot or very cold foods or drinks.  Sweet or sugary foods or drinks. GET HELP IF:  Your pain is not helped with medicines.  Your symptoms are worse.  You have new symptoms. GET HELP RIGHT AWAY IF:  You cannot open your mouth.  You are having trouble breathing or swallowing.  You have a fever.  Your face, neck, or jaw is puffy (swollen).   This information is not intended to replace advice given to you by your health care provider. Make sure you discuss any questions you have with your health care provider.   Document Released: 01/06/2008 Document Revised: 12/04/2014 Document Reviewed: 07/16/2014 Elsevier Interactive Patient Education 2016 Elsevier Inc.  Sore Throat A sore throat is a painful, burning, sore, or scratchy feeling of the throat. There may be pain or tenderness when swallowing or talking. You may have other symptoms with a sore throat. These include coughing, sneezing, fever, or a swollen neck. A sore throat is often the first sign of another sickness. These sicknesses may include a cold, flu, strep throat, or an infection called mono. Most sore throats go away without medical treatment.  HOME CARE   Only take medicine as told by your doctor.  Drink enough fluids to keep your pee (urine)  clear or pale yellow.  Rest as needed.  Try using throat sprays, lozenges, or suck on hard candy (if older than 4 years or as told).  Sip warm liquids, such as broth, herbal tea, or warm water with honey. Try sucking on frozen ice pops or drinking cold liquids.  Rinse the mouth (gargle) with salt water. Mix 1 teaspoon salt with 8 ounces of water.  Do not smoke. Avoid being around others when they are smoking.  Put a humidifier in your bedroom at night to moisten the air. You can also turn on a hot shower and sit in the bathroom for 5-10 minutes. Be sure the bathroom door is closed. GET HELP RIGHT AWAY IF:    You have trouble breathing.  You cannot swallow fluids, soft foods, or your spit (saliva).  You have more puffiness (swelling) in the throat.  Your sore throat does not get better in 7 days.  You feel sick to your stomach (nauseous) and throw up (vomit).  You have a fever or lasting symptoms for more than 2-3 days.  You have a fever and your symptoms suddenly get worse. MAKE SURE YOU:   Understand these instructions.  Will watch your condition.  Will get help right away if you are not doing well or get worse.   This information is not intended to replace advice given to you by your health care provider. Make sure you discuss any questions you have with your health care provider.   Document Released: 04/28/2008 Document Revised: 04/13/2012 Document Reviewed: 03/27/2012 Elsevier Interactive Patient Education 2016 [employer]   [employer] 211 is a great source of information about community services available.  Access by dialing 2-1-1 from anywhere in [place], or by website -  PooledIncome.plwww.nc211.org.   Other Local Resources (Updated 08/2015)  Dental  Care   Services    Phone Number and Address  Cost  [facility] For children 950 - 30 years of age:   Cleaning  Tooth brushing/flossing instruction  Sealants, fillings, crowns  Extractions  Emergency treatment  [phone] [address] [place], [place] [phone] Charges based on family income.  Medicaid and some insurance plans accepted.     [facility] [place]  Cleaning  Sealants, fillings, crowns  Extractions  Emergency treatment [phone] [address] [place], [place]  Pregnant women 818 years of age or older with a Medicaid card  [facility] [place]  Cleaning  Sealants, fillings, crowns  Extractions  Emergency treatment [phone] [address] [place], [place] Pregnant women 30 years of age or older with a Medicaid card  [facility] For children 550 - 30 years of age:   Cleaning  Tooth brushing/flossing instruction  Sealants, fillings, crowns  Extractions  Emergency treatment Limited orthodontic services for patients with Medicaid [phone] [address] [place], [place] [address] Medicaid and [facility] Health Choice cover for children up to age 30 and pregnant women.  Parents of children up to age 30 without Medicaid pay a reduced fee at time of service.  [facility] [place] For children 90 - 30 years of age:   Cleaning  Tooth brushing/flossing instruction  Sealants, fillings, crowns  Extractions  Emergency treatment Limited orthodontic services for patients with Medicaid [phone] [address] [place], [place].  Medicaid and [place]  Health Choice cover for children up to age 61 and pregnant women.  Parents of children up to age 68 without Medicaid pay a reduced fee.  [facility]  Sealants, fillings, crowns  Extractions  Hours: Tuesdays and Thursdays, 4:15 - 8 pm [phone] [address] [place], [place] [address] Services free of charge to [facility] residents ages 18-64 who do not have health insurance, Medicare, [place], or [place] benefits and fall within federal poverty guidelines  [employer]    Provides dental care in addition to primary medical care, nutritional counseling, and pharmacy:  Nurse, mental health, fillings, crowns  Extractions                  [phone] [facility], [address] [place], [place]  [phone] [facility], [address] [place], [place]  [phone] [facility] [place],  [place]  [phone] [facility], [address] [place], [place]  [phone] [facility] [address] [place], [place] Accepts [place], [place], most insurance.  Also provides services available to all with fees adjusted based on ability to pay.    [facility]  Cleaning  Tooth brushing/flossing instruction  Sealants, fillings, crowns  Extractions  Emergency treatment Hours: Tuesdays, Thursdays, and Fridays from 8 am to 5 pm by appointment only. [phone] [address] [place], [place] [address] [facility] residents with Medicaid (depending on eligibility) and children with [facility] Health Choice - call for more information.  [facility]  Extractions only  Hours: 2nd and 4th Thursday of each month from 6:30 am - 9 am.   [phone]. [phone] [place], [place] [address] Ages 87 and older only.  Patients are seen on a first come, first served basis.  [employer]  [employer]  Extractions  Orthodontics  Endodontics  Implants/Crowns/Bridges  Complete and partial dentures [phone] [place], [place] Patients must complete an application for services.  There is often a waiting list.

## 2015-10-31 NOTE — ED Provider Notes (Signed)
CSN: [id number]     Arrival date & time 10/31/15  1610 History   First MD Initiated Contact with Patient 10/31/15 1626     Chief Complaint  Patient presents with  . Sore Throat     (Consider location/radiation/quality/duration/timing/severity/associated sxs/prior Treatment) The history is provided by the patient.     Pt presents with sore throat that began last night after several days of worsening right upper dental pain that has somewhat resolved.  Pt has had intermittent pain in her right upper molar and intermittent abscess as well - it attempting to find follow up to have the tooth pulled.  States the dental pain has actually improved prior to the sore throat. Denies fevers, other URI symptoms, difficulty swallowing or breathing.     Past Medical History  Diagnosis Date  . Preterm labor   . Urinary tract infection    Past Surgical History  Procedure Laterality Date  . Tonsillectomy    . Tubal ligation  06/03/2011    Procedure: POST PARTUM TUBAL LIGATION;  Surgeon: Scheryl Darter, MD;  Location: [facility];  Service: Gynecology;  Laterality: Bilateral;   No family history on file. Social History  Substance Use Topics  . Smoking status: Former Smoker    Quit date: 10/17/2014  . Smokeless tobacco: Never Used  . Alcohol Use: No   OB History    Gravida Para Term Preterm AB TAB SAB Ectopic Multiple Living   Review of Systems  All other systems reviewed and are negative.     Allergies  Review of patient's allergies indicates no known allergies.  Home Medications   Prior to Admission medications   Medication Sig Start Date End Date Taking? Authorizing Provider  guaifenesin (ROBITUSSIN) 100 MG/5ML syrup Take 200 mg by mouth 3 (three) times daily as needed. cough     Historical Provider, MD  ibuprofen (ADVIL,MOTRIN) 800 MG tablet Take 1 tablet (800 mg total) by mouth every 8 (eight) hours as needed. 10/31/15   Trixie Dredge, PA-C  omeprazole (PRILOSEC) 20 MG  capsule Take 1 capsule (20 mg total) by mouth daily. 12/17/14   Arby Barrette, MD  Pediatric Multivit-Minerals-C (GUMMI BEAR MULTIVITAMIN/MIN) CHEW Chew 2 tablets by mouth daily.      Historical Provider, MD  penicillin v potassium (VEETID) 500 MG tablet Take 1 tablet (500 mg total) by mouth 4 (four) times daily. 10/31/15 11/07/15  Trixie Dredge, PA-C   BP 119/78 mmHg  Pulse 60  Temp(Src) 98.9 F (37.2 C) (Oral)  Resp 16  Ht  (1.549 m)  Wt 80.922 kg  BMI 33.73 kg/m2  SpO2 100%  LMP 10/22/2015 (Approximate) Physical Exam  Constitutional: She appears well-developed and well-nourished. No distress.  HENT:  Head: Normocephalic and atraumatic.  Mouth/Throat: Uvula is midline and oropharynx is clear and moist. Mucous membranes are not dry. No trismus in the jaw. No uvula swelling. No oropharyngeal exudate, posterior oropharyngeal edema, posterior oropharyngeal erythema or tonsillar abscesses.  Severe decay of right upper molar.  No facial edema.  Erythema of pharynx.  Tonsils are absent.  No edema or exudate.    Neck: Trachea normal, normal range of motion and phonation normal. Neck supple. No tracheal tenderness present. No rigidity. No tracheal deviation, no edema, no erythema and normal range of motion present.  Cardiovascular: Normal rate.   Pulmonary/Chest: Effort normal and breath sounds normal. No stridor.  Lymphadenopathy:    She has  no cervical adenopathy.  Neurological: She is alert.  Skin: She is not diaphoretic.  Nursing note and vitals reviewed.   ED Course  Procedures (including critical care time) Labs Review Labs Reviewed - No data to display  Imaging Review No results found. I have personally reviewed and evaluated these images and lab results as part of my medical decision-making.   EKG Interpretation None      MDM   Final diagnoses:  Sore throat  Dental infection    Afebrile, nontoxic patient with recent dental pain and new sore throat.  No obvious  abscess.  No concerning findings on exam.  Doubt deep space head or neck infection.  No airway concerns.  Doubt Ludwig's angina.  D/C home with antibiotic, NSAID pain medication and dental follow up.  Discussed findings, treatment, and follow up  with patient.  Pt given return precautions.  Pt verbalizes understanding and agrees with plan.         Trixie Dredgemily Trystin Hargrove, PA-C 10/31/15 1809  Cathren LaineKevin Steinl, MD 10/31/15 [phone]

## 2016-06-22 ENCOUNTER — Emergency Department (HOSPITAL_BASED_OUTPATIENT_CLINIC_OR_DEPARTMENT_OTHER)
Admission: EM | Admit: 2016-06-22 | Discharge: 2016-06-22 | Disposition: A | Discharge status: Home / Self Care | Payer: Medicaid Other | Attending: Emergency Medicine | Admitting: Emergency Medicine

## 2016-06-22 ENCOUNTER — Encounter (HOSPITAL_BASED_OUTPATIENT_CLINIC_OR_DEPARTMENT_OTHER): Payer: Self-pay | Admitting: *Deleted

## 2016-06-22 DIAGNOSIS — J029 Acute pharyngitis, unspecified: Secondary | ICD-10-CM | POA: Insufficient documentation

## 2016-06-22 DIAGNOSIS — Z87891 Personal history of nicotine dependence: Secondary | ICD-10-CM | POA: Insufficient documentation

## 2016-06-22 LAB — RAPID STREP SCREEN (MED CTR MEBANE ONLY): Streptococcus, Group A Screen (Direct): NEGATIVE

## 2016-06-22 NOTE — ED Triage Notes (Signed)
Sore throat x 4 days. Lose of taste x 2 days. She is eating and drinking.

## 2016-06-22 NOTE — ED Provider Notes (Signed)
[facility] Provider Note   CSN: [id number] Arrival date & time: 06/22/16  1932   By signing my name below, I, Jamie Owens, attest that this documentation has been prepared under the direction and in the presence of [facility] Corky Blumstein PA-C.  Electronically Signed: Vista Minkobert Owens, ED Scribe. 06/22/16. 9:18 PM.   History   Chief Complaint Chief Complaint  Patient presents with  . Sore Throat    HPI HPI Comments: Jamie Owens is a 30 y.o. female who presents to the Emergency Department complaining of persistent sore throat that started four days ago. She also complains that her sense of taste is markedly decreased for the past two days, however, she has been eating and drinking as normal. Pt states that her son came home from school today complaining of similar symptoms. She has used cough drops in attempt to relive her sore throat. No loss of ability to swallow. No fever, cough, or rhinorrhea.  The history is provided by the patient. No language interpreter was used.    Past Medical History:  Diagnosis Date  . Preterm labor   . Urinary tract infection     There are no active problems to display for this patient.   Past Surgical History:  Procedure Laterality Date  . TONSILLECTOMY    . TUBAL LIGATION  06/03/2011   Procedure: POST PARTUM TUBAL LIGATION;  Surgeon: Scheryl DarterJames Arnold, MD;  Location: [facility];  Service: Gynecology;  Laterality: Bilateral;    OB History    Gravida Para Term Preterm AB Living   3 2 1 1   2    SAB TAB Ectopic Multiple Live Births                   Home Medications    Prior to Admission medications   Medication Sig Start Date End Date Taking? Authorizing Provider  guaifenesin (ROBITUSSIN) 100 MG/5ML syrup Take 200 mg by mouth 3 (three) times daily as needed. cough     Historical Provider, MD  ibuprofen (ADVIL,MOTRIN) 800 MG tablet Take 1 tablet (800 mg total) by mouth every 8 (eight) hours as needed. 10/31/15   Trixie DredgeEmily West, PA-C  omeprazole  (PRILOSEC) 20 MG capsule Take 1 capsule (20 mg total) by mouth daily. 12/17/14   Arby BarretteMarcy Pfeiffer, MD  Pediatric Multivit-Minerals-C (GUMMI BEAR MULTIVITAMIN/MIN) CHEW Chew 2 tablets by mouth daily.      Historical Provider, MD    Family History No family history on file.  Social History Social History  Substance Use Topics  . Smoking status: Former Smoker    Quit date: 10/17/2014  . Smokeless tobacco: Never Used  . Alcohol use No     Allergies   Patient has no known allergies.   Review of Systems Review of Systems 10 Systems reviewed and all are negative for acute change except as noted in the HPI.   Physical Exam Updated Vital Signs BP 111/72 (BP Location: Right Arm)   Pulse (!) 55   Temp 98.7 F (37.1 C) (Oral)   Resp 18   Ht 5\' 2"  (1.575 m)   Wt 163 lb 8 oz (74.2 kg)   LMP 06/17/2016   SpO2 98%   BMI 29.90 kg/m   Physical Exam  Constitutional: She is oriented to person, place, and time.  HENT:  Right Ear: External ear normal.  Left Ear: External ear normal.  Nose: Nose normal.  Mouth/Throat: Oropharynx is clear and moist. No oropharyngeal exudate.  Eyes: Conjunctivae are normal.  Neck:  Neck supple.  Cardiovascular: Normal rate, regular rhythm, normal heart sounds and intact distal pulses.   Pulmonary/Chest: Effort normal and breath sounds normal. No respiratory distress. She has no wheezes.  Abdominal: Soft. Bowel sounds are normal. She exhibits no distension. There is no tenderness. There is no rebound and no guarding.  Musculoskeletal: She exhibits no edema.  Lymphadenopathy:    She has no cervical adenopathy.  Neurological: She is alert and oriented to person, place, and time. No cranial nerve deficit.  Skin: Skin is warm and dry.  Psychiatric: She has a normal mood and affect.  Nursing note and vitals reviewed.    ED Treatments / Results  DIAGNOSTIC STUDIES: Oxygen Saturation is 98% on RA, normal by my interpretation.  COORDINATION OF CARE: 9:15  PM-Will discharge. Discussed treatment plan with pt at bedside and pt agreed to plan.   Labs (all labs ordered are listed, but only abnormal results are displayed) Labs Reviewed  RAPID STREP SCREEN (NOT AT [facility])  CULTURE, GROUP A STREP [facility]    EKG  EKG Interpretation None       Radiology No results found.  Procedures Procedures (including critical care time)  Medications Ordered in ED Medications - No data to display   Initial Impression / Assessment and Plan / ED Course  I have reviewed the triage vital signs and the nursing notes.  Pertinent labs & imaging results that were available during my care of the patient were reviewed by me and considered in my medical decision making (see chart for details).  Clinical Course     Rapid strep obtained in triage is negative. Will send for GAS culture. Pt otherwise nontoxic appearing with nonfocal exam. She declines prescriptions for supportive meds. Declines IM decadron. Encouraged f/u with pcp. ER return precautions given.  Final Clinical Impressions(s) / ED Diagnoses   Final diagnoses:  Viral pharyngitis    New Prescriptions Discharge Medication List as of 06/22/2016  9:17 PM     I personally performed the services described in this documentation, which was scribed in my presence. The recorded information has been reviewed and is accurate.    Carlene CoriaSerena Y Dicy Smigel, PA-C 06/23/16 [id number]    Arby BarretteMarcy Pfeiffer, MD 06/25/16 [phone]

## 2016-06-22 NOTE — Discharge Instructions (Signed)
You likely have a virus. Follow up with your primary care provider. Return to the emergency room for new or worsening symptoms.

## 2016-06-25 LAB — CULTURE, GROUP A STREP (THRC)

## 2017-02-24 ENCOUNTER — Encounter (HOSPITAL_BASED_OUTPATIENT_CLINIC_OR_DEPARTMENT_OTHER): Payer: Self-pay

## 2017-02-24 ENCOUNTER — Emergency Department (HOSPITAL_BASED_OUTPATIENT_CLINIC_OR_DEPARTMENT_OTHER)
Admission: EM | Admit: 2017-02-24 | Discharge: 2017-02-24 | Disposition: A | Discharge status: Home / Self Care | Payer: Medicaid Other | Attending: Emergency Medicine | Admitting: Emergency Medicine

## 2017-02-24 DIAGNOSIS — Y939 Activity, unspecified: Secondary | ICD-10-CM | POA: Insufficient documentation

## 2017-02-24 DIAGNOSIS — Y929 Unspecified place or not applicable: Secondary | ICD-10-CM | POA: Insufficient documentation

## 2017-02-24 DIAGNOSIS — L299 Pruritus, unspecified: Secondary | ICD-10-CM | POA: Insufficient documentation

## 2017-02-24 DIAGNOSIS — W57XXXA Bitten or stung by nonvenomous insect and other nonvenomous arthropods, initial encounter: Secondary | ICD-10-CM | POA: Insufficient documentation

## 2017-02-24 DIAGNOSIS — Y999 Unspecified external cause status: Secondary | ICD-10-CM | POA: Insufficient documentation

## 2017-02-24 MED ORDER — TRIAMCINOLONE ACETONIDE 0.5 % EX OINT
1.0000 | TOPICAL_OINTMENT | Freq: Two times a day (BID) | CUTANEOUS | 0 refills | Status: DC
Start: 2017-02-24 — End: 2020-10-12

## 2017-02-24 MED ORDER — HYDROXYZINE HCL 25 MG PO TABS: 25.0000 mg | ORAL_TABLET | Freq: Four times a day (QID) | ORAL | 0 refills | Status: DC

## 2017-02-24 NOTE — ED Provider Notes (Signed)
[facility]   CSN: [id number] Arrival date & time: 02/24/17  2139  By signing my name below, I, Linna DarnerRussell Turner, attest that this documentation has been prepared under the direction and in the presence of Cecily Lawhorne, PA-C. Electronically Signed: Linna Darnerussell Turner, Scribe. 02/24/2017. 10:46 PM.  History   Chief Complaint Chief Complaint  Patient presents with  . Insect Bite   The history is provided by the patient. No language interpreter was used.   HPI Comments: Jamie Owens is a 31 y.o. female who presents to the Emergency Department complaining of several worsening insect bite wounds to her extremities over the last few days. The areas are painful, pruritic and erythematous. She has had similar reactions to mosquito bites in the past. No one else in the house has similar symptoms. She has tried prescription clotrimazole and betamethasone 1%/0.05% and OTC Benadryl Cream without relief. She has had similar symptoms in the past that were relieved with Benadryl Cream. Patient denies fevers, chills, shortness breath, chest pain, nausea/vomiting, or any other associated symptoms.   Past Medical History:  Diagnosis Date  . Preterm labor   . Urinary tract infection     There are no active problems to display for this patient.   Past Surgical History:  Procedure Laterality Date  . TONSILLECTOMY    . TUBAL LIGATION  06/03/2011   Procedure: POST PARTUM TUBAL LIGATION;  Surgeon: Scheryl DarterJames Arnold, MD;  Location: [facility];  Service: Gynecology;  Laterality: Bilateral;    OB History    Gravida Para Term Preterm AB Living   3 2 1 1   2    SAB TAB Ectopic Multiple Live Births                   Home Medications    Prior to Admission medications   Medication Sig Start Date End Date Taking? Authorizing Provider  guaifenesin (ROBITUSSIN) 100 MG/5ML syrup Take 200 mg by mouth 3 (three) times daily as needed. cough     [provider]  hydrOXYzine  (ATARAX/VISTARIL) 25 MG tablet Take 1 tablet (25 mg total) by mouth every 6 (six) hours. 02/24/17   Monda Chastain C, PA-C  ibuprofen (ADVIL,MOTRIN) 800 MG tablet Take 1 tablet (800 mg total) by mouth every 8 (eight) hours as needed. 10/31/15   Trixie DredgeWest, Emily, PA-C  omeprazole (PRILOSEC) 20 MG capsule Take 1 capsule (20 mg total) by mouth daily. 12/17/14   Arby BarrettePfeiffer, Marcy, MD  Pediatric Multivit-Minerals-C (GUMMI BEAR MULTIVITAMIN/MIN) CHEW Chew 2 tablets by mouth daily.      [provider]  triamcinolone ointment (KENALOG) 0.5 % Apply 1 application topically 2 (two) times daily. 02/24/17   Anselm PancoastJoy, Shatonya Passon C, PA-C    Family History No family history on file.  Social History Social History  Substance Use Topics  . Smoking status: Former Smoker    Quit date: 10/17/2014  . Smokeless tobacco: Never Used  . Alcohol use Yes     Allergies   Patient has no known allergies.   Review of Systems Review of Systems  Constitutional: Negative for chills and fever.  Skin: Positive for color change and wound.   Physical Exam Updated Vital Signs BP (!) 143/78 (BP Location: Left Arm)   Pulse (!) 56   Temp 98.2 F (36.8 C) (Oral)   Resp 16   Ht 5\' 2"  (1.575 m)   Wt 161 lb 2.5 oz (73.1 kg)   LMP 02/09/2017   SpO2 100%  BMI 29.48 kg/m   Physical Exam  Constitutional: She appears well-developed and well-nourished. No distress.  HENT:  Head: Normocephalic and atraumatic.  Eyes: Conjunctivae are normal.  Neck: Neck supple.  Cardiovascular: Normal rate and regular rhythm.   Pulmonary/Chest: Effort normal.  Neurological: She is alert.  Skin: Skin is warm and dry. She is not diaphoretic.  Multiple large welts on the bilateral upper arms and legs. No vesicles or pustules.   Psychiatric: She has a normal mood and affect. Her behavior is normal.  Nursing note and vitals reviewed.  ED Treatments / Results  Labs (all labs ordered are listed, but only abnormal results are displayed) Labs Reviewed  - No data to display  EKG  EKG Interpretation None       Radiology No results found.  Procedures Procedures (including critical care time)  DIAGNOSTIC STUDIES: Oxygen Saturation is 100% on RA, normal by my interpretation.    COORDINATION OF CARE: 10:45 PM Discussed treatment plan with pt at bedside and pt agreed to plan.  Medications Ordered in ED Medications - No data to display   Initial Impression / Assessment and Plan / ED Course  I have reviewed the triage vital signs and the nursing notes.  Pertinent labs & imaging results that were available during my care of the patient were reviewed by me and considered in my medical decision making (see chart for details).     Patient presents with pruritic areas of erythema. The patient has had similar symptoms with mosquito bites. PCP follow up for any further management. The patient was given instructions for home care as well as return precautions. Patient voices understanding of these instructions, accepts the plan, and is comfortable with discharge.  Final Clinical Impressions(s) / ED Diagnoses   Final diagnoses:  Insect bite, initial encounter  Pruritus    New Prescriptions Discharge Medication List as of 02/24/2017 10:56 PM    START taking these medications   Details  hydrOXYzine (ATARAX/VISTARIL) 25 MG tablet Take 1 tablet (25 mg total) by mouth every 6 (six) hours., Starting Wed 02/24/2017, Print    triamcinolone ointment (KENALOG) 0.5 % Apply 1 application topically 2 (two) times daily., Starting Wed 02/24/2017, Print       I personally performed the services described in this documentation, which was scribed in my presence. The recorded information has been reviewed and is accurate.   Anselm PancoastJoy, Tomislav Micale C, PA-C 02/25/17 0006    Tilden Fossaees, Elizabeth, MD 02/26/17 [phone]

## 2017-02-24 NOTE — ED Triage Notes (Signed)
Has multiple insect bites on body reports she thinks it is fleas from her animal but has now put her animal outside and bombed the house.

## 2017-02-24 NOTE — Discharge Instructions (Signed)
Apply the triamcinolone ointment twice a day as needed for itching. Do not apply to the face or genitals. May take hydroxyzine, as needed, for itching. Use caution as this medication can make you drowsy. Do not drive while taking this medication.

## 2017-02-24 NOTE — ED Notes (Signed)
Pt verbalizes understanding of d/c instructions and denies any further needs at this time. 

## 2017-05-11 ENCOUNTER — Emergency Department (HOSPITAL_BASED_OUTPATIENT_CLINIC_OR_DEPARTMENT_OTHER)
Admission: EM | Admit: 2017-05-11 | Discharge: 2017-05-11 | Disposition: A | Discharge status: Home / Self Care | Payer: Self-pay | Attending: Emergency Medicine | Admitting: Emergency Medicine

## 2017-05-11 ENCOUNTER — Encounter (HOSPITAL_BASED_OUTPATIENT_CLINIC_OR_DEPARTMENT_OTHER): Payer: Self-pay | Admitting: *Deleted

## 2017-05-11 DIAGNOSIS — Z79899 Other long term (current) drug therapy: Secondary | ICD-10-CM | POA: Insufficient documentation

## 2017-05-11 DIAGNOSIS — R1084 Generalized abdominal pain: Secondary | ICD-10-CM | POA: Insufficient documentation

## 2017-05-11 DIAGNOSIS — R112 Nausea with vomiting, unspecified: Secondary | ICD-10-CM | POA: Insufficient documentation

## 2017-05-11 DIAGNOSIS — R1110 Vomiting, unspecified: Secondary | ICD-10-CM | POA: Insufficient documentation

## 2017-05-11 DIAGNOSIS — Z87891 Personal history of nicotine dependence: Secondary | ICD-10-CM | POA: Insufficient documentation

## 2017-05-11 LAB — CBC WITH DIFFERENTIAL/PLATELET
BASOS ABS: 0 10*3/uL (ref 0.0–0.1)
BASOS PCT: 0 %
EOS ABS: 0 10*3/uL (ref 0.0–0.7)
EOS PCT: 0 %
HEMATOCRIT: 38.2 % (ref 36.0–46.0)
Hemoglobin: 12.6 g/dL (ref 12.0–15.0)
Lymphocytes Relative: 25 %
Lymphs Abs: 2.7 10*3/uL (ref 0.7–4.0)
MCH: 30.7 pg (ref 26.0–34.0)
MCHC: 33 g/dL (ref 30.0–36.0)
MCV: 93.2 fL (ref 78.0–100.0)
MONO ABS: 0.8 10*3/uL (ref 0.1–1.0)
MONOS PCT: 7 %
NEUTROS ABS: 7.2 10*3/uL (ref 1.7–7.7)
Neutrophils Relative %: 68 %
Platelets: 326 10*3/uL (ref 150–400)
RBC: 4.1 MIL/uL (ref 3.87–5.11)
RDW: 14.1 % (ref 11.5–15.5)
WBC: 10.7 10*3/uL — ABNORMAL HIGH (ref 4.0–10.5)

## 2017-05-11 LAB — URINALYSIS, ROUTINE W REFLEX MICROSCOPIC
GLUCOSE, UA: NEGATIVE mg/dL
Ketones, ur: 40 mg/dL — AB
LEUKOCYTES UA: NEGATIVE
Nitrite: NEGATIVE
Protein, ur: 30 mg/dL — AB
Specific Gravity, Urine: >1.03 — ABNORMAL HIGH (ref 1.005–1.030)
pH: 5.5 (ref 5.0–8.0)

## 2017-05-11 LAB — COMPREHENSIVE METABOLIC PANEL
ALT: 19 U/L (ref 14–54)
AST: 17 U/L (ref 15–41)
Albumin: 4.3 g/dL (ref 3.5–5.0)
Alkaline Phosphatase: 66 U/L (ref 38–126)
Anion gap: 9 (ref 5–15)
BILIRUBIN TOTAL: 0.7 mg/dL (ref 0.3–1.2)
BUN: 14 mg/dL (ref 6–20)
CO2: 24 mmol/L (ref 22–32)
Calcium: 9.4 mg/dL (ref 8.9–10.3)
Chloride: 105 mmol/L (ref 101–111)
Creatinine, Ser: 0.6 mg/dL (ref 0.44–1.00)
Glucose, Bld: 96 mg/dL (ref 65–99)
POTASSIUM: 4.1 mmol/L (ref 3.5–5.1)
Sodium: 138 mmol/L (ref 135–145)
TOTAL PROTEIN: 7.2 g/dL (ref 6.5–8.1)

## 2017-05-11 LAB — URINALYSIS, MICROSCOPIC (REFLEX)

## 2017-05-11 LAB — LIPASE, BLOOD: LIPASE: 24 U/L (ref 11–51)

## 2017-05-11 LAB — PREGNANCY, URINE: PREG TEST UR: NEGATIVE

## 2017-05-11 MED ORDER — OMEPRAZOLE 20 MG PO CPDR: 20.0000 mg | DELAYED_RELEASE_CAPSULE | Freq: Every day | ORAL | 0 refills | Status: DC

## 2017-05-11 MED ORDER — GI COCKTAIL ~~LOC~~
30.0000 mL | Freq: Once | ORAL | Status: AC
  Administered 2017-05-11: 30 mL via ORAL
  Filled 2017-05-11: qty 30

## 2017-05-11 MED ORDER — PROMETHAZINE HCL 12.5 MG PO TABS: 12.5000 mg | ORAL_TABLET | Freq: Four times a day (QID) | ORAL | 0 refills | Status: DC | PRN

## 2017-05-11 MED ORDER — FAMOTIDINE 20 MG PO TABS: 20.0000 mg | ORAL_TABLET | Freq: Two times a day (BID) | ORAL | 0 refills | Status: DC

## 2017-05-11 MED FILL — OMEPRAZOLE 20 MG CAP: 20 | 30 days supply | Qty: 30 | Fill #0

## 2017-05-11 MED FILL — PROMETHAZINE 12.5 MG TABLET: 12.5 | 5 days supply | Qty: 20 | Fill #0

## 2017-05-11 MED FILL — FAMOTIDINE 20 MG TABLET: 20 | 15 days supply | Qty: 30 | Fill #0

## 2017-05-11 NOTE — ED Triage Notes (Signed)
Abdominal pain. Nausea. States she has a hx of GERD and has a long hx of abdominal pain. Recent increase in stress due to husband leaving her and their 3 kids. She is tearful at triage. Denies SI. States this is all very emotional for her.

## 2017-05-11 NOTE — ED Provider Notes (Signed)
[facility]   CSN: [id number] Arrival date & time: 05/11/17  1215     History   Chief Complaint Chief Complaint  Patient presents with  . Abdominal Pain    HPI Jamie Owens is a 31 y.o. female.  HPI Jamie Owens is a 31 y.o. female presents to ED with abdominal pain with nausea and vomiting for 12 years. Patient states that she vomits daily, mainly in the morning. She reports some belching and burping. She states pain is all over her abdomen, however sometimes it is more localized epigastric area and sometimes to suprapubic area. She denies any vaginal discharge or abnormal bleeding, states she is currently on her period. She has tried taking Maalox which has not helped. She denies any fever or chills. She states her emesis is green and she is showing me a picture of whitish yellow emesis in the toilet bowl. Denies any blood in her emesis. Denies any black or tarry stools. No bright red blood in her stool. No urinary symptoms. No fever or chills. Currently no PCP. Also reports personal stressors which maybe making pain worse.   Past Medical History:  Diagnosis Date  . Preterm labor   . Urinary tract infection     There are no active problems to display for this patient.   Past Surgical History:  Procedure Laterality Date  . TONSILLECTOMY    . TUBAL LIGATION  06/03/2011   Procedure: POST PARTUM TUBAL LIGATION;  Surgeon: Scheryl Darter, MD;  Location: [facility];  Service: Gynecology;  Laterality: Bilateral;    OB History    Gravida Para Term Preterm AB Living   SAB TAB Ectopic Multiple Live Births                   Home Medications    Prior to Admission medications   Medication Sig Start Date End Date Taking? Authorizing Provider  guaifenesin (ROBITUSSIN) 100 MG/5ML syrup Take 200 mg by mouth 3 (three) times daily as needed. cough     [provider]  hydrOXYzine (ATARAX/VISTARIL) 25 MG tablet Take 1 tablet (25 mg  total) by mouth every 6 (six) hours. 02/24/17   Joy, Shawn C, PA-C  ibuprofen (ADVIL,MOTRIN) 800 MG tablet Take 1 tablet (800 mg total) by mouth every 8 (eight) hours as needed. 10/31/15   Trixie Dredge, PA-C  omeprazole (PRILOSEC) 20 MG capsule Take 1 capsule (20 mg total) by mouth daily. 12/17/14   Arby Barrette, MD  Pediatric Multivit-Minerals-C (GUMMI BEAR MULTIVITAMIN/MIN) CHEW Chew 2 tablets by mouth daily.      [provider]  triamcinolone ointment (KENALOG) 0.5 % Apply 1 application topically 2 (two) times daily. 02/24/17   Anselm Pancoast, PA-C    Family History No family history on file.  Social History Social History  Substance Use Topics  . Smoking status: Former Smoker    Quit date: 10/17/2014  . Smokeless tobacco: Never Used  . Alcohol use Yes     Allergies   Patient has no known allergies.   Review of Systems Review of Systems  Constitutional: Negative for chills and fever.  Respiratory: Negative for cough, chest tightness and shortness of breath.   Cardiovascular: Negative for chest pain, palpitations and leg swelling.  Gastrointestinal: Positive for abdominal pain, nausea and vomiting. Negative for diarrhea.  Genitourinary: Negative for dysuria, flank pain, pelvic pain, vaginal bleeding, vaginal discharge and vaginal pain.  Musculoskeletal:  Negative for arthralgias, myalgias, neck pain and neck stiffness.  Skin: Negative for rash.  Neurological: Negative for dizziness, weakness and headaches.  All other systems reviewed and are negative.    Physical Exam Updated Vital Signs BP 126/79   Pulse 91   Temp 98.1 F (36.7 C) (Oral)   Resp 20   Ht  (1.549 m)   Wt 69 kg (152 lb 1.9 oz)   LMP 05/09/2017   SpO2 98%   BMI 28.74 kg/m   Physical Exam  Constitutional: She appears well-developed and well-nourished. No distress.  HENT:  Head: Normocephalic.  Eyes: Conjunctivae are normal.  Neck: Neck supple.  Cardiovascular: Normal rate, regular  rhythm and normal heart sounds.   Pulmonary/Chest: Effort normal and breath sounds normal. No respiratory distress. She has no wheezes. She has no rales.  Abdominal: Soft. Bowel sounds are normal. She exhibits no distension. There is tenderness. There is no rebound.  Diffuse tenderness, no guarding or rebound tenderness  Musculoskeletal: She exhibits no edema.  Neurological: She is alert.  Skin: Skin is warm and dry.  Psychiatric: She has a normal mood and affect. Her behavior is normal.  Nursing note and vitals reviewed.    ED Treatments / Results  Labs (all labs ordered are listed, but only abnormal results are displayed) Labs Reviewed  URINALYSIS, ROUTINE W REFLEX MICROSCOPIC - Abnormal; Notable for the following:       Result Value   Specific Gravity, Urine >1.030 (*)    Hgb urine dipstick MODERATE (*)    Bilirubin Urine SMALL (*)    Ketones, ur 40 (*)    Protein, ur 30 (*)    All other components within normal limits  CBC WITH DIFFERENTIAL/PLATELET - Abnormal; Notable for the following:    WBC 10.7 (*)    All other components within normal limits  URINALYSIS, MICROSCOPIC (REFLEX) - Abnormal; Notable for the following:    Bacteria, UA FEW (*)    Squamous Epithelial / LPF 0-5 (*)    All other components within normal limits  PREGNANCY, URINE  COMPREHENSIVE METABOLIC PANEL  LIPASE, BLOOD    EKG  EKG Interpretation None       Radiology No results found.  Procedures Procedures (including critical care time)  Medications Ordered in ED Medications  gi cocktail (Maalox,Lidocaine,Donnatal) (30 mLs Oral Given 05/11/17 1321)     Initial Impression / Assessment and Plan / ED Course  I have reviewed the triage vital signs and the nursing notes.  Pertinent labs & imaging results that were available during my care of the patient were reviewed by me and considered in my medical decision making (see chart for details).   patient with diffuse abdominal pain for 12 years.  It sounds like her symptoms may be our most consistent with irritable bowel syndrome and possibly gastritis. Currently most of her pain is in the epigastric area and suprapubic area. Her urinalysis was consistent with dehydration, she is on her period and her urinalysis does have moderate hemoglobin. She refused pelvic exam. Her abdomen is otherwise soft. She has no right upper quadrant or right lower quadrant abdominal tenderness. Her LFTs and lipase are normal. Discussed starting treatment for possible acid reflux and discussed switching to a bland diet. Also advised to increase oral fluid intake. Will discharge home with Prilosec and Pepcid. I do not think she needs any further imaging at this time. We'll have her follow-up with family doctor. Return precautions discussed.  Vitals:   05/11/17  1221 05/11/17 1225 05/11/17 1425  BP:  126/79 119/74  Pulse:  91 (!) 52  Resp:  20 16  Temp:  98.1 F (36.7 C)   TempSrc:  Oral   SpO2:  98% 100%  Weight: 69 kg (152 lb 1.9 oz)    Height:  (1.549 m)        Final Clinical Impressions(s) / ED Diagnoses   Final diagnoses:  Generalized abdominal pain  Vomiting, intractability of vomiting not specified, presence of nausea not specified, unspecified vomiting type    New Prescriptions Discharge Medication List as of 05/11/2017  2:24 PM    START taking these medications   Details  famotidine (PEPCID) 20 MG tablet Take 1 tablet (20 mg total) by mouth 2 (two) times daily., Starting Tue 05/11/2017, Print    promethazine (PHENERGAN) 12.5 MG tablet Take 1 tablet (12.5 mg total) by mouth every 6 (six) hours as needed for nausea or vomiting., Starting Tue 05/11/2017, Print         Shontell Prosser, [place], PA-C 05/11/17 1658    Vanetta Mulders, MD 05/14/17 [phone]

## 2017-05-11 NOTE — Discharge Instructions (Signed)
Take pepcid and prilosec daily. Follow up with family doctor as needed. See food recommendation print out. Drink plenty of fluids to prevent dehydration. Follow up with family doctor. Phenergan for nausea and vomiting.

## 2018-02-21 ENCOUNTER — Other Ambulatory Visit: Payer: Self-pay

## 2018-02-21 ENCOUNTER — Encounter (HOSPITAL_BASED_OUTPATIENT_CLINIC_OR_DEPARTMENT_OTHER): Payer: Self-pay

## 2018-02-21 ENCOUNTER — Emergency Department (HOSPITAL_BASED_OUTPATIENT_CLINIC_OR_DEPARTMENT_OTHER)
Admission: EM | Admit: 2018-02-21 | Discharge: 2018-02-21 | Disposition: A | Discharge status: Home / Self Care | Payer: Self-pay | Attending: Emergency Medicine | Admitting: Emergency Medicine

## 2018-02-21 DIAGNOSIS — R51 Headache: Secondary | ICD-10-CM | POA: Insufficient documentation

## 2018-02-21 DIAGNOSIS — F17200 Nicotine dependence, unspecified, uncomplicated: Secondary | ICD-10-CM | POA: Insufficient documentation

## 2018-02-21 DIAGNOSIS — R519 Headache, unspecified: Secondary | ICD-10-CM

## 2018-02-21 DIAGNOSIS — Z79899 Other long term (current) drug therapy: Secondary | ICD-10-CM | POA: Insufficient documentation

## 2018-02-21 MED ORDER — PROMETHAZINE HCL 25 MG PO TABS: 25.0000 mg | ORAL_TABLET | Freq: Three times a day (TID) | ORAL | 0 refills | Status: DC | PRN

## 2018-02-21 NOTE — ED Triage Notes (Signed)
C/o intermittent migraines x 2 week-c/o left eye twitching-NAD-steady gait

## 2018-02-21 NOTE — ED Provider Notes (Signed)
[facility] EMERGENCY DEPARTMENT Provider Note   CSN: [id number] Arrival date & time: 02/21/18  1152     History   Chief Complaint Chief Complaint  Patient presents with  . Migraine    HPI Jamie Owens is a 32 y.o. female.  The history is provided by the patient. No language interpreter was used.  Migraine    Jamie Owens is a 32 y.o. female who presents to the Emergency Department complaining of HA. She presents to the emergency department complaining of headache for the last two weeks. She is experienced intermittent frontal headaches every other day for two weeks. The headaches wax and wane and are described as a throbbing sensation. She can take Tylenol and leave in the headaches will resolve only to return later. She currently has no headache. She does report that her left eye twitches sometimes over the last two weeks as well. No fevers she did recently have an illness with nausea, vomiting, diarrhea now resolved. She denies any chest pain, numbness, weakness, vision changes. She does not currently have a family doctor and takes no medications. Past Medical History:  Diagnosis Date  . Preterm labor   . Urinary tract infection     There are no active problems to display for this patient.   Past Surgical History:  Procedure Laterality Date  . TONSILLECTOMY    . TUBAL LIGATION  06/03/2011   Procedure: POST PARTUM TUBAL LIGATION;  Surgeon: Scheryl Darter, MD;  Location: [facility];  Service: Gynecology;  Laterality: Bilateral;     OB History    Gravida  3   Para  2   Term  1   Preterm  1   AB      Living  2     SAB      TAB      Ectopic      Multiple      Live Births               Home Medications    Prior to Admission medications   Medication Sig Start Date End Date Taking? Authorizing Provider  famotidine (PEPCID) 20 MG tablet Take 1 tablet (20 mg total) by mouth 2 (two) times daily. 05/11/17   Kirichenko, Tatyana, PA-C    guaifenesin (ROBITUSSIN) 100 MG/5ML syrup Take 200 mg by mouth 3 (three) times daily as needed. cough     [provider]  hydrOXYzine (ATARAX/VISTARIL) 25 MG tablet Take 1 tablet (25 mg total) by mouth every 6 (six) hours. 02/24/17   Joy, Shawn C, PA-C  ibuprofen (ADVIL,MOTRIN) 800 MG tablet Take 1 tablet (800 mg total) by mouth every 8 (eight) hours as needed. 10/31/15   Trixie Dredge, PA-C  omeprazole (PRILOSEC) 20 MG capsule Take 1 capsule (20 mg total) by mouth daily. 05/11/17   Jaynie Crumble, PA-C  Pediatric Multivit-Minerals-C (GUMMI BEAR MULTIVITAMIN/MIN) CHEW Chew 2 tablets by mouth daily.      [provider]  promethazine (PHENERGAN) 25 MG tablet Take 1 tablet (25 mg total) by mouth every 8 (eight) hours as needed for nausea or vomiting. 02/21/18   Tilden Fossa, MD  triamcinolone ointment (KENALOG) 0.5 % Apply 1 application topically 2 (two) times daily. 02/24/17   Anselm Pancoast, PA-C    Family History No family history on file.  Social History Social History   Tobacco Use  . Smoking status: Former Smoker    Last attempt to quit: 10/17/2014    Years since quitting:  3.3  . Smokeless tobacco: Never Used  Substance Use Topics  . Alcohol use: Not Currently  . Drug use: No     Allergies   Patient has no known allergies.   Review of Systems Review of Systems  All other systems reviewed and are negative.    Physical Exam Updated Vital Signs BP 118/83 (BP Location: Left Arm)   Pulse 61   Temp 98.2 F (36.8 C) (Oral)   Resp 18   Ht 5\' 1"  (1.549 m)   Wt 69.9 kg (154 lb)   LMP 02/14/2018   SpO2 100%   BMI 29.10 kg/m   Physical Exam  Constitutional: She is oriented to person, place, and time. She appears well-developed and well-nourished.  HENT:  Head: Normocephalic and atraumatic.  TMs clear bilaterally. No mastoid tenderness. Oropharynx without any erythema or edema.  Eyes: Pupils are equal, round, and reactive to light. EOM are normal.   Neck: Neck supple.  Cardiovascular: Normal rate and regular rhythm.  No murmur heard. Pulmonary/Chest: Effort normal and breath sounds normal. No respiratory distress.  Abdominal: Soft. There is no tenderness. There is no rebound and no guarding.  Musculoskeletal: She exhibits no edema or tenderness.  Neurological: She is alert and oriented to person, place, and time. No cranial nerve deficit. Coordination normal.  Five out of five strength in all four extremities with sensation to light touch intact in all four extremities  Skin: Skin is warm and dry.  Psychiatric: She has a normal mood and affect. Her behavior is normal.  Nursing note and vitals reviewed.    ED Treatments / Results  Labs (all labs ordered are listed, but only abnormal results are displayed) Labs Reviewed - No data to display  EKG None  Radiology No results found.  Procedures Procedures (including critical care time)  Medications Ordered in ED Medications - No data to display   Initial Impression / Assessment and Plan / ED Course  I have reviewed the triage vital signs and the nursing notes.  Pertinent labs & imaging results that were available during my care of the patient were reviewed by me and considered in my medical decision making (see chart for details).    patient here for evaluation of two weeks of intermittent headache. She is neurologically intact on examination. No concerning history or exam findings for space occupying lesion, subarachnoid hemorrhage, meningitis, dural sinus thrombosis. Counseled patient on home care for headache. Discussed outpatient follow-up and return precautions.  Final Clinical Impressions(s) / ED Diagnoses   Final diagnoses:  Bad headache    ED Discharge Orders        Ordered    promethazine (PHENERGAN) 25 MG tablet  Every 8 hours PRN     02/21/18 1340       Tilden Fossaees, Tamaiya Bump, MD 02/21/18 1344

## 2018-02-21 NOTE — ED Notes (Signed)
Pt verbalized understanding of dc instructions.

## 2018-02-21 NOTE — ED Notes (Signed)
Pt denies pain at this time, but sts she was wondering if we "could do some XRs."

## 2018-04-07 ENCOUNTER — Encounter (HOSPITAL_COMMUNITY): Payer: Self-pay

## 2018-04-07 ENCOUNTER — Other Ambulatory Visit: Payer: Self-pay

## 2018-04-07 ENCOUNTER — Emergency Department (HOSPITAL_COMMUNITY)
Admission: EM | Admit: 2018-04-07 | Discharge: 2018-04-07 | Disposition: A | Discharge status: Home / Self Care | Payer: Self-pay | Attending: Emergency Medicine | Admitting: Emergency Medicine

## 2018-04-07 DIAGNOSIS — R1110 Vomiting, unspecified: Secondary | ICD-10-CM | POA: Insufficient documentation

## 2018-04-07 DIAGNOSIS — Z87891 Personal history of nicotine dependence: Secondary | ICD-10-CM | POA: Insufficient documentation

## 2018-04-07 DIAGNOSIS — J069 Acute upper respiratory infection, unspecified: Secondary | ICD-10-CM | POA: Insufficient documentation

## 2018-04-07 DIAGNOSIS — R05 Cough: Secondary | ICD-10-CM | POA: Insufficient documentation

## 2018-04-07 DIAGNOSIS — Z79899 Other long term (current) drug therapy: Secondary | ICD-10-CM | POA: Insufficient documentation

## 2018-04-07 DIAGNOSIS — R062 Wheezing: Secondary | ICD-10-CM | POA: Insufficient documentation

## 2018-04-07 MED ORDER — PSEUDOEPHEDRINE HCL 60 MG PO TABS
60.0000 mg | ORAL_TABLET | Freq: Once | ORAL | Status: AC
  Administered 2018-04-07: 60 mg via ORAL
  Filled 2018-04-07: qty 1

## 2018-04-07 MED ORDER — ALBUTEROL SULFATE HFA 108 (90 BASE) MCG/ACT IN AERS
2.0000 | INHALATION_SPRAY | Freq: Once | RESPIRATORY_TRACT | Status: AC
  Administered 2018-04-07: 2 via RESPIRATORY_TRACT
  Filled 2018-04-07: qty 6.7

## 2018-04-07 MED ORDER — DEXAMETHASONE 4 MG PO TABS: 4.0000 mg | ORAL_TABLET | Freq: Two times a day (BID) | ORAL | 0 refills | Status: DC

## 2018-04-07 MED ORDER — PREDNISONE 20 MG PO TABS
40.0000 mg | ORAL_TABLET | Freq: Once | ORAL | Status: AC
  Administered 2018-04-07: 40 mg via ORAL
  Filled 2018-04-07: qty 2

## 2018-04-07 MED ORDER — LORATADINE-PSEUDOEPHEDRINE ER 5-120 MG PO TB12: 1.0000 | ORAL_TABLET | Freq: Two times a day (BID) | ORAL | 0 refills | Status: DC

## 2018-04-07 NOTE — Discharge Instructions (Signed)
Your vital signs within normal limits.  Your pulse oximetry her oxygen level is 97% on room air.  Within normal limits.  Your examination favors an upper respiratory infection.  Please increase fluids.  Please wash hands frequently.  Use Decadron 2 times daily.  Use albuterol 2 puffs every 4 hours.  Use Claritin-D every 12 hours for congestion.  Please see your primary physician or return to the emergency department if not improving.

## 2018-04-07 NOTE — ED Provider Notes (Signed)
[facility] EMERGENCY DEPARTMENT Provider Note   CSN: [id number] Arrival date & time: 04/07/18  2043     History   Chief Complaint Chief Complaint  Patient presents with  . Nasal Congestion    HPI Jamie Owens is a 32 y.o. female.  The history is provided by the patient.  URI   This is a new problem. The current episode started yesterday. The problem has been gradually worsening. There has been no fever. Associated symptoms include vomiting, congestion, sneezing, cough and wheezing. Pertinent negatives include no chest pain, no abdominal pain, no nausea, no dysuria, no neck pain and no rash. She has tried other medications (robitussin) for the symptoms. The treatment provided no relief.    Past Medical History:  Diagnosis Date  . Preterm labor   . Urinary tract infection     There are no active problems to display for this patient.   Past Surgical History:  Procedure Laterality Date  . TONSILLECTOMY    . TUBAL LIGATION  06/03/2011   Procedure: POST PARTUM TUBAL LIGATION;  Surgeon: Scheryl Darter, MD;  Location: [facility];  Service: Gynecology;  Laterality: Bilateral;     OB History    Gravida  3   Para  2   Term  1   Preterm  1   AB      Living  2     SAB      TAB      Ectopic      Multiple      Live Births               Home Medications    Prior to Admission medications   Medication Sig Start Date End Date Taking? Authorizing Provider  famotidine (PEPCID) 20 MG tablet Take 1 tablet (20 mg total) by mouth 2 (two) times daily. 05/11/17   Kirichenko, Tatyana, PA-C  guaifenesin (ROBITUSSIN) 100 MG/5ML syrup Take 200 mg by mouth 3 (three) times daily as needed. cough     [provider]  hydrOXYzine (ATARAX/VISTARIL) 25 MG tablet Take 1 tablet (25 mg total) by mouth every 6 (six) hours. 02/24/17   Joy, Shawn C, PA-C  ibuprofen (ADVIL,MOTRIN) 800 MG tablet Take 1 tablet (800 mg total) by mouth every 8 (eight) hours as needed. 10/31/15   Trixie Dredge, PA-C  omeprazole (PRILOSEC) 20 MG capsule Take 1 capsule (20 mg total) by mouth daily. 05/11/17   Jaynie Crumble, PA-C  Pediatric Multivit-Minerals-C (GUMMI BEAR MULTIVITAMIN/MIN) CHEW Chew 2 tablets by mouth daily.      [provider]  promethazine (PHENERGAN) 25 MG tablet Take 1 tablet (25 mg total) by mouth every 8 (eight) hours as needed for nausea or vomiting. 02/21/18   Tilden Fossa, MD  triamcinolone ointment (KENALOG) 0.5 % Apply 1 application topically 2 (two) times daily. 02/24/17   Anselm Pancoast, PA-C    Family History History reviewed. No pertinent family history.  Social History Social History   Tobacco Use  . Smoking status: Former Smoker    Last attempt to quit: 10/17/2014    Years since quitting: 3.4  . Smokeless tobacco: Never Used  Substance Use Topics  . Alcohol use: Not Currently  . Drug use: No     Allergies   Patient has no known allergies.   Review of Systems Review of Systems  Constitutional: Negative for activity change.       All ROS Neg except as noted in HPI  HENT: Positive for  congestion and sneezing. Negative for nosebleeds.   Eyes: Negative for photophobia and discharge.  Respiratory: Positive for cough and wheezing. Negative for shortness of breath.   Cardiovascular: Negative for chest pain and palpitations.  Gastrointestinal: Positive for vomiting. Negative for abdominal pain, blood in stool and nausea.  Genitourinary: Negative for dysuria, frequency and hematuria.  Musculoskeletal: Negative for arthralgias, back pain and neck pain.  Skin: Negative.  Negative for rash.  Neurological: Negative for dizziness, seizures and speech difficulty.  Psychiatric/Behavioral: Negative for confusion and hallucinations.     Physical Exam Updated Vital Signs BP 119/80 (BP Location: Right Arm)   Pulse 67   Temp 98.6 F (37 C) (Oral)   Ht 5\' 2"  (1.575 m)   Wt 69.9 kg   LMP 03/24/2018   SpO2 97%   BMI 28.17 kg/m   Physical  Exam  Constitutional: She is oriented to person, place, and time. She appears well-developed and well-nourished.  Non-toxic appearance.  HENT:  Head: Normocephalic.  Right Ear: Tympanic membrane and external ear normal.  Left Ear: Tympanic membrane and external ear normal.  Nasal congestion  Eyes: Pupils are equal, round, and reactive to light. EOM and lids are normal.  Neck: Normal range of motion. Neck supple. Carotid bruit is not present.  Cardiovascular: Normal rate, regular rhythm, normal heart sounds, intact distal pulses and normal pulses.  Pulmonary/Chest: No respiratory distress. She has wheezes.  Abdominal: Soft. Bowel sounds are normal. There is no tenderness. There is no guarding.  Musculoskeletal: Normal range of motion.  Lymphadenopathy:       Head (right side): No submandibular adenopathy present.       Head (left side): No submandibular adenopathy present.    She has no cervical adenopathy.  Neurological: She is alert and oriented to person, place, and time. She has normal strength. No cranial nerve deficit or sensory deficit.  Skin: Skin is warm and dry.  Psychiatric: She has a normal mood and affect. Her speech is normal.  Nursing note and vitals reviewed.    ED Treatments / Results  Labs (all labs ordered are listed, but only abnormal results are displayed) Labs Reviewed - No data to display  EKG None  Radiology No results found.  Procedures Procedures (including critical care time)  Medications Ordered in ED Medications - No data to display   Initial Impression / Assessment and Plan / ED Course  I have reviewed the triage vital signs and the nursing notes.  Pertinent labs & imaging results that were available during my care of the patient were reviewed by me and considered in my medical decision making (see chart for details).       Final Clinical Impressions(s) / ED Diagnoses MDM  Vital signs within normal limits.  Pulse oximetry is 98% on  room air.  Within normal limits by my interpretation.  Patient has nasal congestion, as well some soft wheezes present.  Patient speaks in complete sentences without problem.  The examination favors an upper respiratory infection.  The patient will be treated with an albuterol inhaler, steroid medication, and Claritin-D.  I have asked the patient to increase fluids.  Wash hands frequently.  To see the primary physician or return to the emergency department if not improving.   Final diagnoses:  Upper respiratory tract infection, unspecified type    ED Discharge Orders         Ordered    dexamethasone (DECADRON) 4 MG tablet  2 times daily with meals  04/07/18 2243    loratadine-pseudoephedrine (CLARITIN-D 12 HOUR) 5-120 MG tablet  2 times daily     04/07/18 2243           Ivery Quale, PA-C 04/08/18 1433    Eber Hong, MD 04/08/18 1530

## 2018-04-07 NOTE — ED Triage Notes (Signed)
Pt states congestion and cough began yesterday, robitussin was taken yesterday around 2000 before bed with no relief. Pt states v 1x yesterday and n today.

## 2020-03-24 ENCOUNTER — Encounter (HOSPITAL_BASED_OUTPATIENT_CLINIC_OR_DEPARTMENT_OTHER): Payer: Self-pay | Admitting: Emergency Medicine

## 2020-03-24 ENCOUNTER — Other Ambulatory Visit: Payer: Self-pay

## 2020-03-24 ENCOUNTER — Emergency Department (HOSPITAL_BASED_OUTPATIENT_CLINIC_OR_DEPARTMENT_OTHER)
Admission: EM | Admit: 2020-03-24 | Discharge: 2020-03-24 | Disposition: A | Discharge status: Home / Self Care | Payer: HRSA Program | Attending: Emergency Medicine | Admitting: Emergency Medicine

## 2020-03-24 ENCOUNTER — Emergency Department (HOSPITAL_BASED_OUTPATIENT_CLINIC_OR_DEPARTMENT_OTHER): Payer: HRSA Program

## 2020-03-24 DIAGNOSIS — Z20822 Contact with and (suspected) exposure to covid-19: Secondary | ICD-10-CM | POA: Diagnosis not present

## 2020-03-24 DIAGNOSIS — M7918 Myalgia, other site: Secondary | ICD-10-CM | POA: Insufficient documentation

## 2020-03-24 DIAGNOSIS — R0789 Other chest pain: Secondary | ICD-10-CM | POA: Insufficient documentation

## 2020-03-24 DIAGNOSIS — Z87891 Personal history of nicotine dependence: Secondary | ICD-10-CM | POA: Diagnosis not present

## 2020-03-24 LAB — CBC WITH DIFFERENTIAL/PLATELET
Abs Immature Granulocytes: 0.04 10*3/uL (ref 0.00–0.07)
Basophils Absolute: 0.1 10*3/uL (ref 0.0–0.1)
Basophils Relative: 1 %
Eosinophils Absolute: 0.1 10*3/uL (ref 0.0–0.5)
Eosinophils Relative: 1 %
HCT: 39.7 % (ref 36.0–46.0)
Hemoglobin: 13.1 g/dL (ref 12.0–15.0)
Immature Granulocytes: 0 %
Lymphocytes Relative: 28 %
Lymphs Abs: 3 10*3/uL (ref 0.7–4.0)
MCH: 30.8 pg (ref 26.0–34.0)
MCHC: 33 g/dL (ref 30.0–36.0)
MCV: 93.4 fL (ref 80.0–100.0)
Monocytes Absolute: 0.7 10*3/uL (ref 0.1–1.0)
Monocytes Relative: 6 %
Neutro Abs: 6.7 10*3/uL (ref 1.7–7.7)
Neutrophils Relative %: 64 %
Platelets: 340 10*3/uL (ref 150–400)
RBC: 4.25 MIL/uL (ref 3.87–5.11)
RDW: 13.2 % (ref 11.5–15.5)
WBC: 10.5 10*3/uL (ref 4.0–10.5)
nRBC: 0 % (ref 0.0–0.2)

## 2020-03-24 LAB — COMPREHENSIVE METABOLIC PANEL
ALT: 16 U/L (ref 0–44)
AST: 17 U/L (ref 15–41)
Albumin: 4.4 g/dL (ref 3.5–5.0)
Alkaline Phosphatase: 65 U/L (ref 38–126)
Anion gap: 10 (ref 5–15)
BUN: 15 mg/dL (ref 6–20)
CO2: 20 mmol/L — ABNORMAL LOW (ref 22–32)
Calcium: 9.1 mg/dL (ref 8.9–10.3)
Chloride: 107 mmol/L (ref 98–111)
Creatinine, Ser: 0.64 mg/dL (ref 0.44–1.00)
GFR calc Af Amer: >60 mL/min (ref >60)
GFR calc non Af Amer: >60 mL/min (ref >60)
Glucose, Bld: 92 mg/dL (ref 70–99)
Potassium: 4 mmol/L (ref 3.5–5.1)
Sodium: 137 mmol/L (ref 135–145)
Total Bilirubin: 0.7 mg/dL (ref 0.3–1.2)
Total Protein: 7.5 g/dL (ref 6.5–8.1)

## 2020-03-24 LAB — SARS CORONAVIRUS 2 BY RT PCR (HOSPITAL ORDER, PERFORMED IN ~~LOC~~ HOSPITAL LAB): SARS Coronavirus 2: NEGATIVE

## 2020-03-24 LAB — TROPONIN I (HIGH SENSITIVITY): Troponin I (High Sensitivity): 3 ng/L (ref <18)

## 2020-03-24 NOTE — ED Notes (Signed)
Pt discharged to home. Discharge instructions have been discussed with patient and/or family members. Pt verbally acknowledges understanding d/c instructions, and endorses comprehension to checkout at registration before leaving.  °

## 2020-03-24 NOTE — ED Notes (Signed)
Portable X-ray

## 2020-03-24 NOTE — Discharge Instructions (Addendum)
I think you most likely have Covid even though your Covid test was negative today. As you spoke about if your chest pain becomes worse, if you have any shortness of breath, leg swelling, respiratory distress please come back to the emergency department as well. We will need follow-up with primary care, if you do not have one of them go to community health and wellness. I want you to isolate for 14 days. As we spoke about if you have any new or worsening concerning pain please come back to the emergency department. Use the attached instructions. You can take Tylenol as prescribed on bottle for pain.

## 2020-03-24 NOTE — ED Provider Notes (Signed)
[facility] EMERGENCY DEPARTMENT Provider Note   CSN: [id number] Arrival date & time: 03/24/20  1245     History Chief Complaint  Patient presents with  . COVID symptoms    Jamie Owens is a 34 y.o. female with no pertinent past medical history that presents to the emergency department today for myalgias.  Patient has 3 sons that have Covid currently, were diagnosed today.  Is living with them.  States that she has chest pain, worse on inspiration.  Also is tender around the chest.  Did not get Covid vaccines.  Also states that she has intermittent headache.  Denies cough, fever, chills, nausea, vomiting, diarrhea, nausea, congestion, sore throat.  States that she was normal health before this.  Is not a smoker.  No history of PE or DVT.  No recent recent surgery or trauma, no history of cancer, no hemoptysis, no leg swelling or calf tenderness.  No coagulation history disorder.  No estrogen use.  No chance of pregnancy. HPI     Past Medical History:  Diagnosis Date  . Preterm labor   . Urinary tract infection     There are no problems to display for this patient.   Past Surgical History:  Procedure Laterality Date  . TONSILLECTOMY    . TUBAL LIGATION  06/03/2011   Procedure: POST PARTUM TUBAL LIGATION;  Surgeon: Scheryl Darter, MD;  Location: [facility];  Service: Gynecology;  Laterality: Bilateral;     OB History    Gravida  3   Para  2   Term  1   Preterm  1   AB      Living  2     SAB      TAB      Ectopic      Multiple      Live Births              No family history on file.  Social History   Tobacco Use  . Smoking status: Former Smoker    Quit date: 10/17/2014    Years since quitting: 5.4  . Smokeless tobacco: Never Used  Substance Use Topics  . Alcohol use: Not Currently  . Drug use: No    Home Medications Prior to Admission medications   Medication Sig Start Date End Date Taking? Authorizing Provider  dexamethasone  (DECADRON) 4 MG tablet Take 1 tablet (4 mg total) by mouth 2 (two) times daily with a meal. 04/07/18   Ivery Quale, PA-C  famotidine (PEPCID) 20 MG tablet Take 1 tablet (20 mg total) by mouth 2 (two) times daily. 05/11/17   Kirichenko, Tatyana, PA-C  guaifenesin (ROBITUSSIN) 100 MG/5ML syrup Take 200 mg by mouth 3 (three) times daily as needed. cough     [provider]  hydrOXYzine (ATARAX/VISTARIL) 25 MG tablet Take 1 tablet (25 mg total) by mouth every 6 (six) hours. 02/24/17   Joy, Shawn C, PA-C  ibuprofen (ADVIL,MOTRIN) 800 MG tablet Take 1 tablet (800 mg total) by mouth every 8 (eight) hours as needed. 10/31/15   Trixie Dredge, PA-C  loratadine-pseudoephedrine (CLARITIN-D 12 HOUR) 5-120 MG tablet Take 1 tablet by mouth 2 (two) times daily. 04/07/18   Ivery Quale, PA-C  omeprazole (PRILOSEC) 20 MG capsule Take 1 capsule (20 mg total) by mouth daily. 05/11/17   Jaynie Crumble, PA-C  Pediatric Multivit-Minerals-C (GUMMI BEAR MULTIVITAMIN/MIN) CHEW Chew 2 tablets by mouth daily.      [provider]  promethazine (PHENERGAN) 25 MG  tablet Take 1 tablet (25 mg total) by mouth every 8 (eight) hours as needed for nausea or vomiting. 02/21/18   Tilden Fossa, MD  triamcinolone ointment (KENALOG) 0.5 % Apply 1 application topically 2 (two) times daily. 02/24/17   Joy, Hillard Danker, PA-C    Allergies    Patient has no known allergies.  Review of Systems   Review of Systems  Constitutional: Negative for chills, diaphoresis, fatigue and fever.  HENT: Negative for congestion, sore throat and trouble swallowing.   Eyes: Negative for pain and visual disturbance.  Respiratory: Negative for cough, shortness of breath and wheezing.   Cardiovascular: Positive for chest pain. Negative for palpitations and leg swelling.  Gastrointestinal: Negative for abdominal distention, abdominal pain, diarrhea, nausea and vomiting.  Genitourinary: Negative for difficulty urinating.  Musculoskeletal:  Positive for myalgias. Negative for back pain, neck pain and neck stiffness.  Skin: Negative for pallor.  Neurological: Negative for dizziness, speech difficulty, weakness and headaches.  Psychiatric/Behavioral: Negative for confusion.    Physical Exam Updated Vital Signs BP 119/75 (BP Location: Right Arm)   Pulse 71   Temp 98.1 F (36.7 C) (Oral)   Resp 18   Ht 5\' 2"  (1.575 m)   Wt 73 kg   SpO2 100%   BMI 29.44 kg/m   Physical Exam Constitutional:      General: She is not in acute distress.    Appearance: Normal appearance. She is not ill-appearing, toxic-appearing or diaphoretic.     Comments: Patient without acute respiratory stress.  Patient is sitting comfortably in bed, no tripoding, use of accessory muscles.  Patient is speaking to me in full sentences.  Handling secretions well.  HENT:     Head: Normocephalic and atraumatic.     Jaw: There is normal jaw occlusion. No trismus, swelling or malocclusion.     Nose: No congestion or rhinorrhea.     Right Sinus: No maxillary sinus tenderness or frontal sinus tenderness.     Left Sinus: No maxillary sinus tenderness or frontal sinus tenderness.     Mouth/Throat:     Mouth: Mucous membranes are moist. No oral lesions.     Dentition: Normal dentition.     Tongue: No lesions.     Palate: No mass and lesions.     Pharynx: Oropharynx is clear. Uvula midline. No pharyngeal swelling, oropharyngeal exudate, posterior oropharyngeal erythema or uvula swelling.     Tonsils: No tonsillar exudate or tonsillar abscesses. 1+ on the right. 1+ on the left.     Comments: Patient without tonsillar enlargement or exudate.  No signs of peritonsillar abscess, palate without any tenderness or masses palpated.  No swelling under the tongue, uvula is midline without any inflammation. Eyes:     General: No visual field deficit or scleral icterus.       Right eye: No discharge.        Left eye: No discharge.     Extraocular Movements: Extraocular  movements intact.     Conjunctiva/sclera: Conjunctivae normal.     Pupils: Pupils are equal, round, and reactive to light.  Cardiovascular:     Rate and Rhythm: Normal rate and regular rhythm.     Pulses: Normal pulses.     Heart sounds: Normal heart sounds. No murmur heard.  No friction rub. No gallop.   Pulmonary:     Effort: Pulmonary effort is normal. No respiratory distress.     Breath sounds: Normal breath sounds. No stridor. No wheezing, rhonchi or rales.  Chest:     Chest wall: Tenderness present.  Abdominal:     General: Abdomen is flat. Bowel sounds are normal. There is no distension.     Palpations: Abdomen is soft.     Tenderness: There is no abdominal tenderness. There is no right CVA tenderness, left CVA tenderness, guarding or rebound.  Musculoskeletal:        General: No swelling or tenderness. Normal range of motion.     Cervical back: Normal range of motion and neck supple. No rigidity or tenderness.     Right lower leg: No edema.     Left lower leg: No edema.  Lymphadenopathy:     Cervical: No cervical adenopathy.  Skin:    General: Skin is warm and dry.     Capillary Refill: Capillary refill takes less than 2 seconds.     Coloration: Skin is not pale.     Findings: No erythema or rash.  Neurological:     General: No focal deficit present.     Mental Status: She is alert and oriented to person, place, and time.     Cranial Nerves: Cranial nerves are intact. No cranial nerve deficit or facial asymmetry.     Motor: Motor function is intact. No weakness.     Coordination: Coordination is intact.     Gait: Gait is intact. Gait normal.  Psychiatric:        Mood and Affect: Mood normal.        Behavior: Behavior normal.     ED Results / Procedures / Treatments   Labs (all labs ordered are listed, but only abnormal results are displayed) Labs Reviewed  COMPREHENSIVE METABOLIC PANEL - Abnormal; Notable for the following components:      Result Value   CO2 20  (*)    All other components within normal limits  SARS CORONAVIRUS 2 BY RT PCR (HOSPITAL ORDER, PERFORMED IN [facility])  CBC WITH DIFFERENTIAL/PLATELET  TROPONIN I (HIGH SENSITIVITY)  TROPONIN I (HIGH SENSITIVITY)    EKG EKG Interpretation  Date/Time:  Sunday March 24 2020 15:53:37 EDT Ventricular Rate:  60 PR Interval:  168 QRS Duration: 86 QT Interval:  366 QTC Calculation: 366 R Axis:   81 Text Interpretation: Normal sinus rhythm Normal ECG No previous tracing Confirmed by Gwyneth Sprout ([id number]) on 03/24/2020 4:36:49 PM   Radiology DG Chest Port 1 View  Result Date: 03/24/2020 CLINICAL DATA:  Left upper chest pain. EXAM: PORTABLE CHEST 1 VIEW COMPARISON:  None. FINDINGS: The heart size and mediastinal contours are within normal limits. Both lungs are clear. The visualized skeletal structures are unremarkable. IMPRESSION: No active disease. Electronically Signed   By: Aram Candela M.D.   On: 03/24/2020 16:38    Procedures Procedures (including critical care time)  Medications Ordered in ED Medications - No data to display  ED Course  I have reviewed the triage vital signs and the nursing notes.  Pertinent labs & imaging results that were available during my care of the patient were reviewed by me and considered in my medical decision making (see chart for details).    MDM Rules/Calculators/A&P                         YVETTE LOVELESS is a 34 y.o. female.  With no pertinent past medical history that presents to the emergency department today for myalgias, specific to chest.  I think that patient most likely has Covid  although Covid test was negative.  I think she just has not tested positive yet, did explain this in depth to patient and to still isolate.  In emergency patient chest pain, CBC and CMP stable.  Chest x-ray interpreted by me without any acute cardiopulmonary disease.  EKG interpreted by me without any signs of ischemia or arrhythmia.  First  troponin less than 4.  Heart score 0, do not think we need repeat troponin at this time.PERC negative.  The patient symptoms are from Covid.  Tenderness to chest, suggesting myalgias.  Patient is to be discharged with recommendation to follow up with PCP in regards to today's hospital visit. Chest pain is not likely of cardiac or pulmonary etiology d/t presentation, PERC negative, VSS, no tracheal deviation, no JVD or new murmur, RRR, breath sounds equal bilaterally, EKG without acute abnormalities, negative troponin, and negative CXR. Pt has been advised to return to the ED if CP becomes exertional, associated with diaphoresis or nausea, radiates to left jaw/arm, worsens or becomes concerning in any way. Pt appears reliable for follow up and is agreeable to discharge.   Doubt need for further emergent work up at this time. I explained the diagnosis and have given explicit precautions to return to the ER including for any other new or worsening symptoms. The patient understands and accepts the medical plan as it's been dictated and I have answered their questions. Discharge instructions concerning home care and prescriptions have been given. The patient is STABLE and is discharged to home in good condition.   Final Clinical Impression(s) / ED Diagnoses Final diagnoses:  Suspected COVID-19 virus infection    Rx / DC Orders ED Discharge Orders    None       Farrel Gordonatel, Dyami Umbach, PA-C 03/24/20 1709    Tegeler, Canary Brimhristopher J, MD 03/24/20 1721

## 2020-03-24 NOTE — ED Triage Notes (Signed)
Pain with inspiration. Son is COVID+

## 2020-10-12 ENCOUNTER — Encounter (HOSPITAL_BASED_OUTPATIENT_CLINIC_OR_DEPARTMENT_OTHER): Payer: Self-pay | Admitting: Emergency Medicine

## 2020-10-12 ENCOUNTER — Other Ambulatory Visit: Payer: Self-pay

## 2020-10-12 ENCOUNTER — Emergency Department (HOSPITAL_BASED_OUTPATIENT_CLINIC_OR_DEPARTMENT_OTHER)
Admission: EM | Admit: 2020-10-12 | Discharge: 2020-10-12 | Disposition: A | Discharge status: Home / Self Care | Payer: Self-pay | Attending: Emergency Medicine | Admitting: Emergency Medicine

## 2020-10-12 DIAGNOSIS — K0889 Other specified disorders of teeth and supporting structures: Secondary | ICD-10-CM | POA: Insufficient documentation

## 2020-10-12 DIAGNOSIS — Z87891 Personal history of nicotine dependence: Secondary | ICD-10-CM | POA: Insufficient documentation

## 2020-10-12 MED ORDER — PENICILLIN V POTASSIUM 500 MG PO TABS: 500.0000 mg | ORAL_TABLET | Freq: Four times a day (QID) | ORAL | 0 refills | Status: AC

## 2020-10-12 NOTE — ED Triage Notes (Signed)
Pt reports that she used dental floss in the area of a broken tooth yesterday and woke up today with facial swelling to left side of face.

## 2020-10-12 NOTE — ED Provider Notes (Signed)
Emergency Department Provider Note   I have reviewed the triage vital signs and the nursing notes.   HISTORY  Chief Complaint Dental Pain   HPI Jamie Owens is a 35 y.o. female with past medical history reviewed below presents to the emergency department for evaluation of left upper dental pain with left face swelling.  Symptoms developed in the last 24 hours.  Patient has a broken tooth when the left upper molar location.  She thought that there was some food stuck there yesterday and used floss which caused some discomfort.  She woke up this morning with swelling to the left face.  She denies severe headache, fevers, change in vision, tinnitus.  No difficulty swallowing or breathing.  Denies sensation of swelling in the throat.  She has an appointment with the dentist this coming week and would like some antibiotics to reduce swelling so that they can treat her definitively this coming week.   Past Medical History:  Diagnosis Date  . Preterm labor   . Urinary tract infection     There are no problems to display for this patient.   Past Surgical History:  Procedure Laterality Date  . TONSILLECTOMY    . TUBAL LIGATION  06/03/2011   Procedure: POST PARTUM TUBAL LIGATION;  Surgeon: Scheryl Darter, MD;  Location: [facility];  Service: Gynecology;  Laterality: Bilateral;    Allergies Patient has no known allergies.  No family history on file.  Social History Social History   Tobacco Use  . Smoking status: Former Smoker    Quit date: 10/17/2014    Years since quitting: 5.9  . Smokeless tobacco: Never Used  Vaping Use  . Vaping Use: Never used  Substance Use Topics  . Alcohol use: Not Currently  . Drug use: No    Review of Systems  Constitutional: No fever/chills Eyes: No visual changes. ENT: No sore throat. Positive dental pain.  Respiratory: Denies shortness of breath. Gastrointestinal: No abdominal pain.  No nausea, no vomiting.  No diarrhea. Neurological:  Negative for headaches  10-point ROS otherwise negative.  ____________________________________________   PHYSICAL EXAM:  VITAL SIGNS: ED Triage Vitals  Enc Vitals Group     BP 10/12/20 0850 129/69     Pulse Rate 10/12/20 0850 63     Resp 10/12/20 0850 18     Temp 10/12/20 0850 98.1 F (36.7 C)     Temp Source 10/12/20 0850 Oral     SpO2 10/12/20 0850 100 %     Weight 10/12/20 0852 170 lb (77.1 kg)     Height 10/12/20 0852 5\' 2"  (1.575 m)   Constitutional: Alert and oriented. Well appearing and in no acute distress. Eyes: Conjunctivae are normal. Head: Atraumatic. Nose: No congestion/rhinnorhea. Mouth/Throat: Mucous membranes are moist.  Oropharynx non-erythematous.  No trismus.  Patient is speaking in a clear voice and managing oral secretions.  Submandibular compartment is soft.  There is chronic appearing decay in multiple locations including the left maxillary molars.  There is no visible abscess.  There is some swelling over the cheek and left face but no face cellulitis.  No visible external abscess.  Neck: No stridor.  Cardiovascular: Normal rate, regular rhythm.  Respiratory: Normal respiratory effort.  Gastrointestinal: No distention.  Musculoskeletal: No gross deformities of extremities. Neurologic:  Normal speech and language.  Skin:  Skin is warm, dry and intact. No rash noted.  ____________________________________________   PROCEDURES  Procedure(s) performed:   Procedures  None  ____________________________________________  INITIAL IMPRESSION / ASSESSMENT AND PLAN / ED COURSE  Pertinent labs & imaging results that were available during my care of the patient were reviewed by me and considered in my medical decision making (see chart for details).   Patient presents to the emergency department for evaluation of dental pain and swelling.  She has an appointment with dentistry this coming week and is requesting antibiotics to reduce swelling.  I do not  see a drainable abscess either internally or externally.  There is no facial cellulitis.  Exam is not consistent with Ludwig's angina or deeper space neck infection requiring emergent imaging.  Plan to start with penicillin antibiotics and patient will keep her follow-up appointment with dentistry this coming week.  Discussed ED return precautions.   ____________________________________________  FINAL CLINICAL IMPRESSION(S) / ED DIAGNOSES  Final diagnoses:  Pain, dental    NEW OUTPATIENT MEDICATIONS STARTED DURING THIS VISIT:  New Prescriptions   PENICILLIN V POTASSIUM (VEETID) 500 MG TABLET    Take 1 tablet (500 mg total) by mouth 4 (four) times daily for 7 days.    Note:  This document was prepared using Dragon voice recognition software and may include unintentional dictation errors.  Alona Bene, MD, [facility]    Long, Arlyss Repress, MD 10/12/20 [phone]

## 2020-10-12 NOTE — Discharge Instructions (Signed)
You were seen in the emerge department today with dental pain and face swelling.  I am starting antibiotics which you should take as directed for the next week.  You may take Tylenol and or Motrin as needed for pain and apply cool compress to the face to help with swelling reduction.  Please keep your appointment with the dentist.  I have listed the name of a primary care doctor if you do not already have one. Please call to make an appointment.   If you develop vision change, headache/fever, difficulty breathing, difficulty swallowing please return to the emergency department for reevaluation or call 911.

## 2021-02-13 ENCOUNTER — Other Ambulatory Visit: Payer: Self-pay

## 2021-02-13 ENCOUNTER — Emergency Department (HOSPITAL_BASED_OUTPATIENT_CLINIC_OR_DEPARTMENT_OTHER)
Admission: EM | Admit: 2021-02-13 | Discharge: 2021-02-13 | Disposition: A | Discharge status: Home / Self Care | Payer: Self-pay | Attending: Emergency Medicine | Admitting: Emergency Medicine

## 2021-02-13 ENCOUNTER — Encounter (HOSPITAL_BASED_OUTPATIENT_CLINIC_OR_DEPARTMENT_OTHER): Payer: Self-pay

## 2021-02-13 ENCOUNTER — Ambulatory Visit: Payer: Self-pay | Admitting: *Deleted

## 2021-02-13 DIAGNOSIS — Z87891 Personal history of nicotine dependence: Secondary | ICD-10-CM | POA: Insufficient documentation

## 2021-02-13 DIAGNOSIS — T7840XA Allergy, unspecified, initial encounter: Secondary | ICD-10-CM

## 2021-02-13 DIAGNOSIS — W57XXXA Bitten or stung by nonvenomous insect and other nonvenomous arthropods, initial encounter: Secondary | ICD-10-CM | POA: Insufficient documentation

## 2021-02-13 DIAGNOSIS — S70362A Insect bite (nonvenomous), left thigh, initial encounter: Secondary | ICD-10-CM | POA: Insufficient documentation

## 2021-02-13 MED ORDER — FAMOTIDINE 20 MG PO TABS: 20.0000 mg | ORAL_TABLET | Freq: Two times a day (BID) | ORAL | 0 refills | Status: AC

## 2021-02-13 MED ORDER — FAMOTIDINE 20 MG PO TABS: 20.0000 mg | ORAL_TABLET | Freq: Two times a day (BID) | ORAL | 0 refills | Status: DC

## 2021-02-13 MED ORDER — PREDNISONE 10 MG PO TABS: 20.0000 mg | ORAL_TABLET | Freq: Every day | ORAL | 0 refills | Status: DC

## 2021-02-13 MED ORDER — HYDROCORTISONE 1 % EX LOTN: 1.0000 "application " | TOPICAL_LOTION | Freq: Two times a day (BID) | CUTANEOUS | 0 refills | Status: DC

## 2021-02-13 MED ORDER — HYDROCORTISONE 1 % EX LOTN: 1.0000 "application " | TOPICAL_LOTION | Freq: Two times a day (BID) | CUTANEOUS | 0 refills | Status: AC

## 2021-02-13 NOTE — Telephone Encounter (Signed)
Patient c/o right knee swelling and pain and unable to walk on due to bee sting yesterday. Patient reports she was stung yesterday by a yellow bee that landed on her knee and she hit it and she started having palpitations, vomiting, could not focus. Took a benadryl yesterday and sx got better. Today left knee red ,  swollen, throbbing pain, unable to walk. Denies chest pain , breathing difficulties, or face or tongue swelling. Patient does not have a PCP and was instructed to get evaluated at ED. Care advise given. Patient verbalized understanding of care advise and to go to [facility] or ED and to call 911 if symptoms worsen.

## 2021-02-13 NOTE — Telephone Encounter (Signed)
Reason for Disposition  Swelling is huge (e.g., more than 4 inches or 10 cm, spreads beyond wrist or ankle)  Answer Assessment - Initial Assessment Questions 1. TYPE: "What type of sting was it?" (bee, yellow jacket, etc.)      Yellow bee 2. ONSET: "When did it occur?"      Yesterday  3. LOCATION: "Where is the sting located?"  "How many stings?"     Left knee 4. SWELLING SIZE: "How big is the swelling?" (e.g., inches or cm)     Large as her "hand " 5. REDNESS: "Is the area red or pink?" If Yes, ask: "What size is area of redness?" (e.g., inches or cm). "When did the redness start?"     Red covers knee 6. PAIN: "Is there any pain?" If Yes, ask: "How bad is it?"  (Scale 1-10; or mild, moderate, severe)     Yes pain in bad enough patient can not stand on leg 7. ITCHING: "Is there any itching?" If Yes, ask: "How bad is it?"      no 8. RESPIRATORY DISTRESS: "Describe your breathing."     ok 9. PRIOR REACTIONS: "Have you had any severe allergic reactions to stings in the past?" if yes, ask: "What happened?"     No  10. OTHER SYMPTOMS: "Do you have any other symptoms?" (e.g., abdominal pain, face or tongue swelling, new rash elsewhere, vomiting)       None today . Yesterday chest palpitations, vomiting, could not focus  11. PREGNANCY: "Is there any chance you are pregnant?" "When was your last menstrual period?"       na  Protocols used: Bee or Yellow Jacket Sting-A-AH

## 2021-02-13 NOTE — Discharge Instructions (Signed)
You can apply hydrocortisone cream to the area as needed.  I recommend taking Claritin during the day, Benadryl at night.  You should also take Pepcid once in the morning.  I like you to take prednisone, which is a steroid, twice daily.  Do this for exactly 5 days.  Combination of these medicine should knock out the allergic reaction.  You start having difficulty breathing or chest pain.  Please return back to the ED for further evaluation.

## 2021-02-13 NOTE — ED Triage Notes (Signed)
Pt c/o bee sting to left leg yesterday-reports increase in pain and redness-NAD-limping gait

## 2021-02-13 NOTE — ED Provider Notes (Signed)
[facility] EMERGENCY DEPARTMENT Provider Note   CSN: [id number] Arrival date & time: 02/13/21  1238     History Chief Complaint  Patient presents with   Insect Bite    Jamie Owens is a 35 y.o. female.  HPI  Patient presents with bee sting to the left thigh.  Happened outside when she was gardening yesterday.  It is red, swollen, painful.  She took Benadryl last night with some relief.  She has not had any difficulty breathing or handling fluids.  No chest pain.  Past Medical History:  Diagnosis Date   Preterm labor    Urinary tract infection     There are no problems to display for this patient.   Past Surgical History:  Procedure Laterality Date   TONSILLECTOMY     TUBAL LIGATION  06/03/2011   Procedure: POST PARTUM TUBAL LIGATION;  Surgeon: Scheryl Darter, MD;  Location: [facility];  Service: Gynecology;  Laterality: Bilateral;     OB History     Gravida  3   Para  2   Term  1   Preterm  1   AB      Living  2      SAB      IAB      Ectopic      Multiple      Live Births              No family history on file.  Social History   Tobacco Use   Smoking status: Former    Types: Cigarettes    Quit date: 10/17/2014    Years since quitting: 6.3   Smokeless tobacco: Never  Vaping Use   Vaping Use: Never used  Substance Use Topics   Alcohol use: Not Currently   Drug use: No    Home Medications Prior to Admission medications   Not on File    Allergies    Patient has no known allergies.  Review of Systems   Review of Systems  Constitutional:  Negative for fever.  Skin:  Positive for color change.   Physical Exam Updated Vital Signs BP 135/79 (BP Location: Left Arm)   Pulse 63   Temp 98.1 F (36.7 C) (Oral)   Resp 20   Ht 5\' 2"  (1.575 m)   Wt 79.8 kg   LMP 02/11/2021   SpO2 100%   BMI 32.19 kg/m   Physical Exam Vitals and nursing note reviewed. Exam conducted with a chaperone present.  Constitutional:       General: She is not in acute distress.    Appearance: Normal appearance.  HENT:     Head: Normocephalic and atraumatic.     Mouth/Throat:     Comments: Airway is patent, handling secretions appropriately.  No tongue swelling. Eyes:     General: No scleral icterus.    Extraocular Movements: Extraocular movements intact.     Pupils: Pupils are equal, round, and reactive to light.  Cardiovascular:     Rate and Rhythm: Normal rate and regular rhythm.  Skin:    Coloration: Skin is not jaundiced.     Comments: Area of erythema to the left thigh.  Please see photo below.  Slightly warm to touch, but not suspicious for cellulitis given history.  Neurological:     Mental Status: She is alert. Mental status is at baseline.     Coordination: Coordination normal.     ED Results / Procedures / Treatments   Labs (  all labs ordered are listed, but only abnormal results are displayed) Labs Reviewed - No data to display  EKG None  Radiology No results found.  Procedures Procedures   Medications Ordered in ED Medications - No data to display  ED Course  I have reviewed the triage vital signs and the nursing notes.  Pertinent labs & imaging results that were available during my care of the patient were reviewed by me and considered in my medical decision making (see chart for details).    MDM Rules/Calculators/A&P                          Patient vital stable, airway patent, not tachycardic.  I doubt anaphylaxis. Does not look like cellulitis. Advised her to treat as an allergic reaction.  Follow-up instructions given.  Patient verbalizes understanding and agreement with the plan.  Final Clinical Impression(s) / ED Diagnoses Final diagnoses:  None    Rx / DC Orders ED Discharge Orders     None        Theron Arista, PA-C 02/13/21 1305    Virgina Norfolk, DO 02/13/21 1821

## 2022-02-28 IMAGING — DX DG CHEST 1V PORT
1 series · 1 of 1 positions shown · non-contrast
Comparison: None.

CLINICAL DATA: Left upper chest pain.

EXAM:
PORTABLE CHEST 1 VIEW

[chest ap]
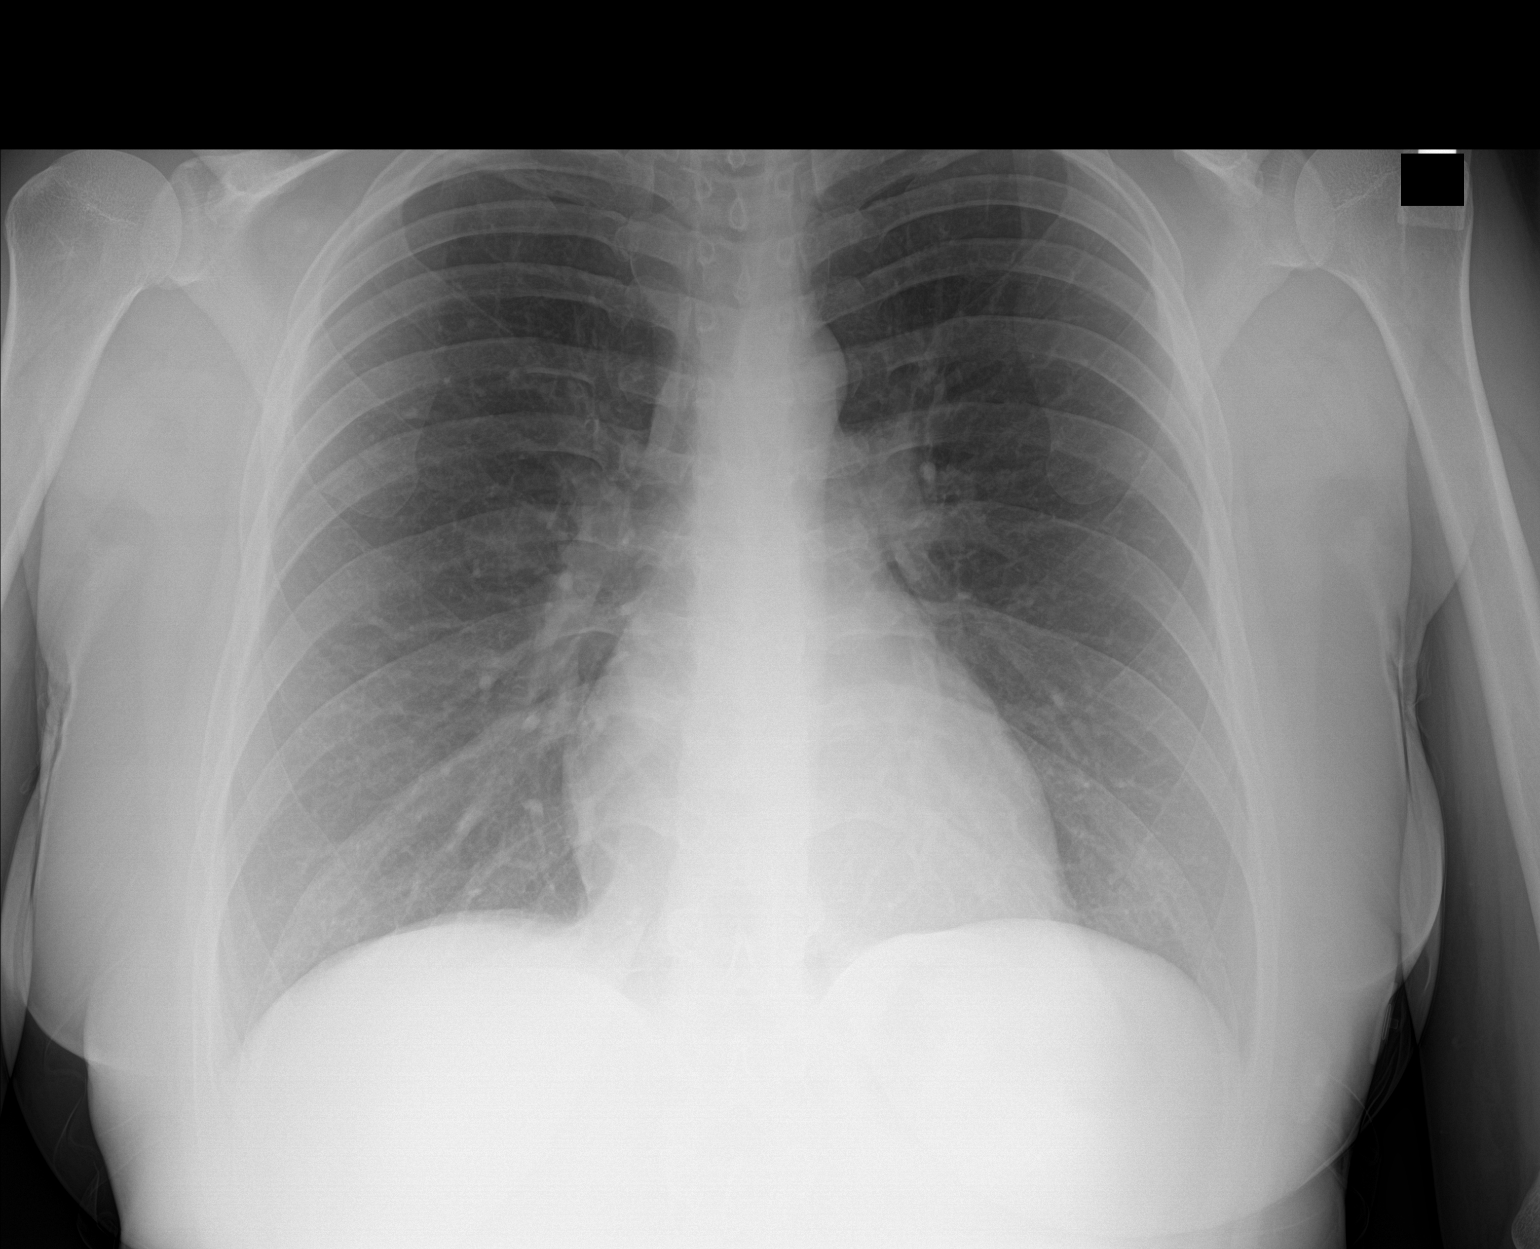

[1 of 1 positions shown; findings below may reference images not displayed]

FINDINGS: The heart size and mediastinal contours are within normal limits.
Both lungs are clear. The visualized skeletal structures are
unremarkable.
IMPRESSION: No active disease.

## 2022-04-14 ENCOUNTER — Encounter (HOSPITAL_BASED_OUTPATIENT_CLINIC_OR_DEPARTMENT_OTHER): Payer: Self-pay | Admitting: *Deleted

## 2022-04-14 ENCOUNTER — Other Ambulatory Visit: Payer: Self-pay

## 2022-04-14 ENCOUNTER — Emergency Department (HOSPITAL_BASED_OUTPATIENT_CLINIC_OR_DEPARTMENT_OTHER)
Admission: EM | Admit: 2022-04-14 | Discharge: 2022-04-14 | Disposition: A | Discharge status: Home / Self Care | Payer: Self-pay | Attending: Emergency Medicine | Admitting: Emergency Medicine

## 2022-04-14 DIAGNOSIS — Z0279 Encounter for issue of other medical certificate: Secondary | ICD-10-CM | POA: Insufficient documentation

## 2022-04-14 DIAGNOSIS — U071 COVID-19: Secondary | ICD-10-CM | POA: Insufficient documentation

## 2022-04-14 NOTE — ED Notes (Signed)
Discharge paperwork given and verbally understood. 

## 2022-04-14 NOTE — ED Provider Notes (Signed)
[facility] EMERGENCY DEPT Provider Note   CSN: [id number] Arrival date & time: 04/14/22  1101     History  Chief Complaint  Patient presents with   Letter for School/Work    Jamie Owens is a 36 y.o. female Patient here for work note.  Flu like sxs onset yesterday. She tested positive at home today. Sxs are mild. No cp, sob.  No comorbidities  HPI     Home Medications Prior to Admission medications   Medication Sig Start Date End Date Taking? Authorizing Provider  famotidine (PEPCID) 20 MG tablet Take 1 tablet (20 mg total) by mouth 2 (two) times daily. 02/13/21   Theron Arista, PA-C  hydrocortisone 1 % lotion Apply 1 application topically 2 (two) times daily. 02/13/21   Theron Arista, PA-C  predniSONE (DELTASONE) 10 MG tablet Take 2 tablets (20 mg total) by mouth daily. 02/13/21   Theron Arista, PA-C      Allergies    Patient has no known allergies.    Review of Systems   Review of Systems  Physical Exam Updated Vital Signs BP 115/75 (BP Location: Right Arm)   Pulse (!) 55   Temp 98.2 F (36.8 C)   Resp 14   LMP 04/09/2022   SpO2 100%  Physical Exam Vitals and nursing note reviewed.  Constitutional:      General: She is not in acute distress.    Appearance: She is well-developed. She is not diaphoretic.  HENT:     Head: Normocephalic and atraumatic.     Right Ear: External ear normal.     Left Ear: External ear normal.     Nose: Nose normal.     Mouth/Throat:     Mouth: Mucous membranes are moist.  Eyes:     General: No scleral icterus.    Conjunctiva/sclera: Conjunctivae normal.  Cardiovascular:     Rate and Rhythm: Normal rate and regular rhythm.     Heart sounds: Normal heart sounds. No murmur heard.    No friction rub. No gallop.  Pulmonary:     Effort: Pulmonary effort is normal. No respiratory distress.     Breath sounds: Normal breath sounds.  Abdominal:     General: Bowel sounds are normal. There is no distension.     Palpations:  Abdomen is soft. There is no mass.     Tenderness: There is no abdominal tenderness. There is no guarding.  Musculoskeletal:     Cervical back: Normal range of motion.  Skin:    General: Skin is warm and dry.  Neurological:     Mental Status: She is alert and oriented to person, place, and time.  Psychiatric:        Behavior: Behavior normal.     ED Results / Procedures / Treatments   Labs (all labs ordered are listed, but only abnormal results are displayed) Labs Reviewed - No data to display  EKG None  Radiology No results found.  Procedures Procedures    Medications Ordered in ED Medications - No data to display  ED Course/ Medical Decision Making/ A&P                           Medical Decision Making  Patient with mild covid sxs Work note provided. Appears appropriate for dc        Final Clinical Impression(s) / ED Diagnoses Final diagnoses:  COVID-19 virus infection    Rx / DC Orders ED  Discharge Orders     None         Arthor Captain, PA-C 04/14/22 1830    Rondel Baton, MD 04/15/22 [phone]

## 2022-04-14 NOTE — ED Notes (Signed)
Symptoms started last week, around Thursday. Small cough, body aches and headache. Took test when started, was negative for COVID. Took test yesterday was positive for COVID. Needs note for Work.

## 2022-04-14 NOTE — ED Triage Notes (Signed)
Pt states that she tested positive for covid on home test yesterday.  She has test with her but is here as her work requires her to have a work note to be out this week. Pt describes her symptoms as mild having headache and some GI symtoms.

## 2022-10-05 ENCOUNTER — Emergency Department (HOSPITAL_BASED_OUTPATIENT_CLINIC_OR_DEPARTMENT_OTHER)
Admission: EM | Admit: 2022-10-05 | Discharge: 2022-10-05 | Disposition: A | Discharge status: Home / Self Care | Payer: Self-pay | Attending: Emergency Medicine | Admitting: Emergency Medicine

## 2022-10-05 ENCOUNTER — Other Ambulatory Visit: Payer: Self-pay

## 2022-10-05 ENCOUNTER — Encounter (HOSPITAL_BASED_OUTPATIENT_CLINIC_OR_DEPARTMENT_OTHER): Payer: Self-pay

## 2022-10-05 DIAGNOSIS — K529 Noninfective gastroenteritis and colitis, unspecified: Secondary | ICD-10-CM | POA: Insufficient documentation

## 2022-10-05 LAB — COMPREHENSIVE METABOLIC PANEL
ALT: 13 U/L (ref 0–44)
AST: 12 U/L — ABNORMAL LOW (ref 15–41)
Albumin: 4.2 g/dL (ref 3.5–5.0)
Alkaline Phosphatase: 57 U/L (ref 38–126)
Anion gap: 7 (ref 5–15)
BUN: 16 mg/dL (ref 6–20)
CO2: 25 mmol/L (ref 22–32)
Calcium: 9.3 mg/dL (ref 8.9–10.3)
Chloride: 106 mmol/L (ref 98–111)
Creatinine, Ser: 0.67 mg/dL (ref 0.44–1.00)
GFR, Estimated: >60 mL/min (ref >60)
Glucose, Bld: 110 mg/dL — ABNORMAL HIGH (ref 70–99)
Potassium: 4.1 mmol/L (ref 3.5–5.1)
Sodium: 138 mmol/L (ref 135–145)
Total Bilirubin: 0.2 mg/dL — ABNORMAL LOW (ref 0.3–1.2)
Total Protein: 6.6 g/dL (ref 6.5–8.1)

## 2022-10-05 LAB — CBC WITH DIFFERENTIAL/PLATELET
Abs Immature Granulocytes: 0.05 K/uL (ref 0.00–0.07)
Basophils Absolute: 0.1 K/uL (ref 0.0–0.1)
Basophils Relative: 1 %
Eosinophils Absolute: 0.2 K/uL (ref 0.0–0.5)
Eosinophils Relative: 2 %
HCT: 38.9 % (ref 36.0–46.0)
Hemoglobin: 13 g/dL (ref 12.0–15.0)
Immature Granulocytes: 0 %
Lymphocytes Relative: 27 %
Lymphs Abs: 3.3 K/uL (ref 0.7–4.0)
MCH: 30.9 pg (ref 26.0–34.0)
MCHC: 33.4 g/dL (ref 30.0–36.0)
MCV: 92.4 fL (ref 80.0–100.0)
Monocytes Absolute: 1 K/uL (ref 0.1–1.0)
Monocytes Relative: 8 %
Neutro Abs: 7.4 K/uL (ref 1.7–7.7)
Neutrophils Relative %: 62 %
Platelets: 370 K/uL (ref 150–400)
RBC: 4.21 MIL/uL (ref 3.87–5.11)
RDW: 13.4 % (ref 11.5–15.5)
WBC: 11.9 K/uL — ABNORMAL HIGH (ref 4.0–10.5)
nRBC: 0 % (ref 0.0–0.2)

## 2022-10-05 LAB — LIPASE, BLOOD: Lipase: 24 U/L (ref 11–51)

## 2022-10-05 LAB — HCG, SERUM, QUALITATIVE: Preg, Serum: NEGATIVE

## 2022-10-05 MED ORDER — ONDANSETRON 4 MG PO TBDP: 4.0000 mg | ORAL_TABLET | Freq: Three times a day (TID) | ORAL | 0 refills | Status: AC | PRN

## 2022-10-05 MED ORDER — ONDANSETRON 4 MG PO TBDP
4.0000 mg | ORAL_TABLET | Freq: Once | ORAL | Status: AC
  Administered 2022-10-05: 4 mg via ORAL
  Filled 2022-10-05: qty 1

## 2022-10-05 NOTE — ED Notes (Signed)
Patient given sprite for PO challenge.

## 2022-10-05 NOTE — ED Provider Notes (Signed)
[facility] Provider Note   CSN: [id number] Arrival date & time: 10/05/22  0303     History  Chief Complaint  Patient presents with   Emesis    Jamie Owens is a 37 y.o. female.  HPI     This is a 37 year old female who presents with concerns for nausea and vomiting.  Patient reports that she woke up around 1 AM this morning with nausea and vomiting.  States that she has had diarrhea for over 1 week.  No known sick contacts.  No fevers.  No urinary symptoms.  She reports crampy abdominal discomfort.  She did not take anything for pain prior to arrival.  Home Medications Prior to Admission medications   Medication Sig Start Date End Date Taking? Authorizing Provider  ondansetron (ZOFRAN-ODT) 4 MG disintegrating tablet Take 1 tablet (4 mg total) by mouth every 8 (eight) hours as needed for nausea or vomiting. 10/05/22  Yes Alvester Eads, Barbette Hair, MD  famotidine (PEPCID) 20 MG tablet Take 1 tablet (20 mg total) by mouth 2 (two) times daily. 02/13/21   Sherrill Raring, PA-C  hydrocortisone 1 % lotion Apply 1 application topically 2 (two) times daily. 02/13/21   Sherrill Raring, PA-C  predniSONE (DELTASONE) 10 MG tablet Take 2 tablets (20 mg total) by mouth daily. 02/13/21   Sherrill Raring, PA-C      Allergies    Patient has no known allergies.    Review of Systems   Review of Systems  Gastrointestinal:  Positive for abdominal pain, diarrhea, nausea and vomiting.  All other systems reviewed and are negative.   Physical Exam Updated Vital Signs BP 104/65   Pulse (!) 52   Temp 98 F (36.7 C) (Oral)   Resp 16   Ht 1.575 m ('5\' 2"'$ )   Wt 72.6 kg   LMP 09/24/2022 (Approximate)   SpO2 99%   BMI 29.26 kg/m  Physical Exam Vitals and nursing note reviewed.  Constitutional:      Appearance: She is well-developed. She is not ill-appearing.  HENT:     Head: Normocephalic and atraumatic.     Mouth/Throat:     Mouth: Mucous membranes are moist.  Eyes:      Pupils: Pupils are equal, round, and reactive to light.  Cardiovascular:     Rate and Rhythm: Normal rate and regular rhythm.  Pulmonary:     Effort: Pulmonary effort is normal. No respiratory distress.  Abdominal:     Palpations: Abdomen is soft.     Tenderness: There is no abdominal tenderness. There is no guarding or rebound.  Musculoskeletal:     Cervical back: Neck supple.  Skin:    General: Skin is warm and dry.  Neurological:     Mental Status: She is alert and oriented to person, place, and time.  Psychiatric:        Mood and Affect: Mood normal.     ED Results / Procedures / Treatments   Labs (all labs ordered are listed, but only abnormal results are displayed) Labs Reviewed  CBC WITH DIFFERENTIAL/PLATELET - Abnormal; Notable for the following components:      Result Value   WBC 11.9 (*)    All other components within normal limits  COMPREHENSIVE METABOLIC PANEL - Abnormal; Notable for the following components:   Glucose, Bld 110 (*)    AST 12 (*)    Total Bilirubin 0.2 (*)    All other components within normal limits  LIPASE,  BLOOD  HCG, SERUM, QUALITATIVE    EKG None  Radiology No results found.  Procedures Procedures    Medications Ordered in ED Medications  ondansetron (ZOFRAN-ODT) disintegrating tablet 4 mg (4 mg Oral Given 10/05/22 [id number])    ED Course/ Medical Decision Making/ A&P                             Medical Decision Making Amount and/or Complexity of Data Reviewed Labs: ordered.  Risk Prescription drug management.   This patient presents to the ED for concern of nausea, vomiting, this involves an extensive number of treatment options, and is a complaint that carries with it a high risk of complications and morbidity.  I considered the following differential and admission for this acute, potentially life threatening condition.  The differential diagnosis includes gastritis, gastroenteritis, pancreatitis, cholecystitis  MDM:     This is a 37 year old female who presents with with nausea and vomiting.  Has had some diarrhea and some crampy abdominal discomfort.  She is nontoxic and vital signs are reassuring.  Exam is fairly benign with only mild epigastric tenderness to palpation.  Labs obtained and reviewed.  Lipase normal.  LFTs normal.  She is not pregnant.  Patient was given Zofran and improved.  Highly suspect gastroenteritis.  Will discharge with Zofran.  (Labs, imaging, consults)  Labs: I Ordered, and personally interpreted labs.  The pertinent results include: CBC, CMP, lipase  Imaging Studies ordered: I ordered imaging studies including none I independently visualized and interpreted imaging. I agree with the radiologist interpretation  Additional history obtained from chart review.  External records from outside source obtained and reviewed including prior evaluations  Cardiac Monitoring: The patient was maintained on a cardiac monitor.  If on the cardiac monitor, I personally viewed and interpreted the cardiac monitored which showed an underlying rhythm of: Sinus rhythm  Reevaluation: After the interventions noted above, I reevaluated the patient and found that they have :improved  Social Determinants of Health:  lives independently  Disposition: Discharge  Co morbidities that complicate the patient evaluation  Past Medical History:  Diagnosis Date   Preterm labor    Urinary tract infection      Medicines Meds ordered this encounter  Medications   ondansetron (ZOFRAN-ODT) disintegrating tablet 4 mg   ondansetron (ZOFRAN-ODT) 4 MG disintegrating tablet    Sig: Take 1 tablet (4 mg total) by mouth every 8 (eight) hours as needed for nausea or vomiting.    Dispense:  20 tablet    Refill:  0    I have reviewed the patients home medicines and have made adjustments as needed  Problem List / ED Course: Problem List Items Addressed This Visit   None Visit Diagnoses     Gastroenteritis     -  Primary                   Final Clinical Impression(s) / ED Diagnoses Final diagnoses:  Gastroenteritis    Rx / DC Orders ED Discharge Orders          Ordered    ondansetron (ZOFRAN-ODT) 4 MG disintegrating tablet  Every 8 hours PRN        10/05/22 0417              Merryl Hacker, MD 10/05/22 [phone]

## 2022-10-05 NOTE — ED Triage Notes (Signed)
POV from home, A&O x4, GCS 15, amb to room  Sts approx 1am she woke up with upset stomach and pain, began vomiting. Hx of acid reflux. Generalized abd pain.

## 2022-10-05 NOTE — Discharge Instructions (Signed)
You are seen today for nausea and vomiting.  Your lab work was reassuring.  You likely have a viral illness.  Take medications as prescribed.  Make sure that you are staying hydrated.

## 2022-10-12 ENCOUNTER — Emergency Department (HOSPITAL_BASED_OUTPATIENT_CLINIC_OR_DEPARTMENT_OTHER): Payer: Self-pay

## 2022-10-12 ENCOUNTER — Encounter (HOSPITAL_BASED_OUTPATIENT_CLINIC_OR_DEPARTMENT_OTHER): Payer: Self-pay | Admitting: Emergency Medicine

## 2022-10-12 ENCOUNTER — Other Ambulatory Visit: Payer: Self-pay

## 2022-10-12 ENCOUNTER — Emergency Department (HOSPITAL_BASED_OUTPATIENT_CLINIC_OR_DEPARTMENT_OTHER)
Admission: EM | Admit: 2022-10-12 | Discharge: 2022-10-12 | Disposition: A | Discharge status: Home / Self Care | Payer: Self-pay | Attending: Emergency Medicine | Admitting: Emergency Medicine

## 2022-10-12 DIAGNOSIS — N83201 Unspecified ovarian cyst, right side: Secondary | ICD-10-CM | POA: Insufficient documentation

## 2022-10-12 DIAGNOSIS — R102 Pelvic and perineal pain: Secondary | ICD-10-CM

## 2022-10-12 LAB — URINALYSIS, ROUTINE W REFLEX MICROSCOPIC
Bacteria, UA: NONE SEEN
Bilirubin Urine: NEGATIVE
Glucose, UA: NEGATIVE mg/dL
Ketones, ur: NEGATIVE mg/dL
Nitrite: NEGATIVE
Protein, ur: NEGATIVE mg/dL
Specific Gravity, Urine: 1.016 (ref 1.005–1.030)
pH: 6.5 (ref 5.0–8.0)

## 2022-10-12 LAB — COMPREHENSIVE METABOLIC PANEL
ALT: 13 U/L (ref 0–44)
AST: 12 U/L — ABNORMAL LOW (ref 15–41)
Albumin: 4 g/dL (ref 3.5–5.0)
Alkaline Phosphatase: 56 U/L (ref 38–126)
Anion gap: 7 (ref 5–15)
BUN: 13 mg/dL (ref 6–20)
CO2: 25 mmol/L (ref 22–32)
Calcium: 9.6 mg/dL (ref 8.9–10.3)
Chloride: 107 mmol/L (ref 98–111)
Creatinine, Ser: 0.55 mg/dL (ref 0.44–1.00)
GFR, Estimated: >60 mL/min (ref >60)
Glucose, Bld: 82 mg/dL (ref 70–99)
Potassium: 3.9 mmol/L (ref 3.5–5.1)
Sodium: 139 mmol/L (ref 135–145)
Total Bilirubin: 0.2 mg/dL — ABNORMAL LOW (ref 0.3–1.2)
Total Protein: 6.8 g/dL (ref 6.5–8.1)

## 2022-10-12 LAB — CBC
HCT: 36.7 % (ref 36.0–46.0)
Hemoglobin: 12.3 g/dL (ref 12.0–15.0)
MCH: 31.2 pg (ref 26.0–34.0)
MCHC: 33.5 g/dL (ref 30.0–36.0)
MCV: 93.1 fL (ref 80.0–100.0)
Platelets: 335 10*3/uL (ref 150–400)
RBC: 3.94 MIL/uL (ref 3.87–5.11)
RDW: 13.2 % (ref 11.5–15.5)
WBC: 12.1 10*3/uL — ABNORMAL HIGH (ref 4.0–10.5)
nRBC: 0 % (ref 0.0–0.2)

## 2022-10-12 LAB — LIPASE, BLOOD: Lipase: 26 U/L (ref 11–51)

## 2022-10-12 LAB — WET PREP, GENITAL
Clue Cells Wet Prep HPF POC: NONE SEEN
Sperm: NONE SEEN
Trich, Wet Prep: NONE SEEN
WBC, Wet Prep HPF POC: >=10 — AB (ref <10)
Yeast Wet Prep HPF POC: NONE SEEN

## 2022-10-12 LAB — HCG, SERUM, QUALITATIVE: Preg, Serum: NEGATIVE

## 2022-10-12 MED ORDER — CEFTRIAXONE SODIUM 500 MG IJ SOLR
500.0000 mg | Freq: Once | INTRAMUSCULAR | Status: AC
  Administered 2022-10-12: 500 mg via INTRAMUSCULAR
  Filled 2022-10-12: qty 500

## 2022-10-12 MED ORDER — DOXYCYCLINE HYCLATE 100 MG PO CAPS: 100.0000 mg | ORAL_CAPSULE | Freq: Two times a day (BID) | ORAL | 0 refills | Status: AC

## 2022-10-12 NOTE — Discharge Instructions (Addendum)
Take antibiotics as directed.  Make sure that you follow-up with the OB/GYN listed above.  You need to call make an appointment.  You had a complex looking cystic structure on your right ovary that needs further evaluation with a repeat ultrasound in a few weeks to ensure that it is not a more concerning lesion.  Return to the emergency room if you have any worsening symptoms.

## 2022-10-12 NOTE — ED Triage Notes (Addendum)
Pt arrives to ED with c/o vaginal pain that started today. She notes when she sits she has deep rectal pain. LMP 09/24/22. She notes a "ball" growth to her labia for many months. She notes diarrhea for many weeks. Denies vaginal bleeding or discharge today. Denies new partners.

## 2022-10-12 NOTE — ED Notes (Signed)
Patient transported to Ultrasound by rad staff.  

## 2022-10-12 NOTE — ED Provider Notes (Signed)
[facility] Provider Note   CSN: [id number] Arrival date & time: 10/12/22  1830     History  Chief Complaint  Patient presents with   Vaginal Pain    DONNIKA PADMANABHAN is a 37 y.o. female.  Patient is a 37 year old female who presents with lower abdominal/pelvic pain.  She states that it started hurting earlier today.  She describes it as a sharp pain across her lower abdomen.  She says that she has had a growth on her labia for a long time now but has not had it checked out although she does not feel like that is what is hurting.  She denies any vaginal bleeding or discharge.  No urinary symptoms.  No nausea vomiting or diarrhea.  No fevers.       Home Medications Prior to Admission medications   Medication Sig Start Date End Date Taking? Authorizing Provider  doxycycline (VIBRAMYCIN) 100 MG capsule Take 1 capsule (100 mg total) by mouth 2 (two) times daily. One po bid x 7 days 10/12/22  Yes Malvin Johns, MD  famotidine (PEPCID) 20 MG tablet Take 1 tablet (20 mg total) by mouth 2 (two) times daily. 02/13/21   Sherrill Raring, PA-C  hydrocortisone 1 % lotion Apply 1 application topically 2 (two) times daily. 02/13/21   Sherrill Raring, PA-C  ondansetron (ZOFRAN-ODT) 4 MG disintegrating tablet Take 1 tablet (4 mg total) by mouth every 8 (eight) hours as needed for nausea or vomiting. 10/05/22   Horton, Barbette Hair, MD  predniSONE (DELTASONE) 10 MG tablet Take 2 tablets (20 mg total) by mouth daily. 02/13/21   Sherrill Raring, PA-C      Allergies    Patient has no known allergies.    Review of Systems   Review of Systems  Constitutional:  Negative for chills, diaphoresis, fatigue and fever.  HENT:  Negative for congestion, rhinorrhea and sneezing.   Eyes: Negative.   Respiratory:  Negative for cough, chest tightness and shortness of breath.   Cardiovascular:  Negative for chest pain and leg swelling.  Gastrointestinal:  Positive for abdominal pain.  Negative for blood in stool, diarrhea, nausea and vomiting.  Genitourinary:  Positive for pelvic pain. Negative for difficulty urinating, flank pain, frequency and hematuria.  Musculoskeletal:  Negative for arthralgias and back pain.  Skin:  Negative for rash.  Neurological:  Negative for dizziness, speech difficulty, weakness, numbness and headaches.    Physical Exam Updated Vital Signs BP 113/75   Pulse (!) 58   Temp 97.9 F (36.6 C) (Oral)   Resp 17   Ht '5\' 2"'$  (1.575 m)   Wt 72.6 kg   LMP 09/24/2022 (Approximate)   SpO2 98%   BMI 29.27 kg/m  Physical Exam Constitutional:      Appearance: She is well-developed.  HENT:     Head: Normocephalic and atraumatic.  Eyes:     Pupils: Pupils are equal, round, and reactive to light.  Cardiovascular:     Rate and Rhythm: Normal rate and regular rhythm.     Heart sounds: Normal heart sounds.  Pulmonary:     Effort: Pulmonary effort is normal. No respiratory distress.     Breath sounds: Normal breath sounds. No wheezing or rales.  Chest:     Chest wall: No tenderness.  Abdominal:     General: Bowel sounds are normal.     Palpations: Abdomen is soft.     Tenderness: There is no abdominal tenderness. There is no  guarding or rebound.  Genitourinary:    Comments: No masses or abnormal swelling noted to the labia.  There is a tiny less than 1 cm cystic-like structure at the introitus around 5 o'clock position.  It is nontender and does not appear to be inflamed.  There is some thin white discharge.  Positive cervical motion tenderness, mild bilateral adnexal tenderness Musculoskeletal:        General: Normal range of motion.     Cervical back: Normal range of motion and neck supple.  Lymphadenopathy:     Cervical: No cervical adenopathy.  Skin:    General: Skin is warm and dry.     Findings: No rash.  Neurological:     Mental Status: She is alert and oriented to person, place, and time.     ED Results / Procedures / Treatments    Labs (all labs ordered are listed, but only abnormal results are displayed) Labs Reviewed  WET PREP, GENITAL - Abnormal; Notable for the following components:      Result Value   WBC, Wet Prep HPF POC >=10 (*)    All other components within normal limits  COMPREHENSIVE METABOLIC PANEL - Abnormal; Notable for the following components:   AST 12 (*)    Total Bilirubin 0.2 (*)    All other components within normal limits  CBC - Abnormal; Notable for the following components:   WBC 12.1 (*)    All other components within normal limits  URINALYSIS, ROUTINE W REFLEX MICROSCOPIC - Abnormal; Notable for the following components:   Hgb urine dipstick SMALL (*)    Leukocytes,Ua TRACE (*)    All other components within normal limits  LIPASE, BLOOD  HCG, SERUM, QUALITATIVE  GC/CHLAMYDIA PROBE AMP ([place]) NOT AT [facility]    EKG None  Radiology US PELVIC COMPLETE W TRANSVAGINAL AND TORSION R/O  Result Date: 10/12/2022 CLINICAL DATA:  Pain. EXAM: TRANSABDOMINAL AND TRANSVAGINAL ULTRASOUND OF PELVIS DOPPLER ULTRASOUND OF OVARIES TECHNIQUE: Both transabdominal and transvaginal ultrasound examinations of the pelvis were performed. Transabdominal technique was performed for global imaging of the pelvis including uterus, ovaries, adnexal regions, and pelvic cul-de-sac. It was necessary to proceed with endovaginal exam following the transabdominal exam to visualize the ovaries and endometrium. Color and duplex Doppler ultrasound was utilized to evaluate blood flow to the ovaries. COMPARISON:  None FINDINGS: Uterus Measurements: 7.7 x 3.6 x 4.5 cm = volume: 65 mL. No fibroids or other mass visualized. Endometrium Thickness: 12 mm.  No focal abnormality visualized. Right ovary Measurements: 5.8 x 3.2 x 2.8 cm = volume: 27 mL. There is a 3.2 x 2.7 x 3 point 0 cm rounded mildly heterogeneous isoechoic lesion exophytic from or adjacent to the right ovary. There is no significant internal vascularity in this  region. Left ovary Measurements: 2.6 x 1.6 x 1.5 cm = volume: 3.2 mL. Normal appearance/no adnexal mass. Pulsed Doppler evaluation of both ovaries demonstrates normal low-resistance arterial and venous waveforms. Other findings No abnormal free fluid. IMPRESSION: 1. Indeterminate 3.2 cm rounded heterogeneous isoechoic lesion exophytic from or adjacent to the right ovary. There is no significant internal vascularity in this region. This may represent a hemorrhagic cyst or endometrioma. Other solid lesions ovarian are not excluded. Recommend follow-up pelvic ultrasound in 6-8 weeks to assess for interval change/resolution. Alternatively, this can be further characterized with pelvic MRI. 2. No evidence of ovarian torsion. 3. Unremarkable uterus and left ovary. Electronically Signed   By: Ronney Asters M.D.   On:  10/12/2022 21:00    Procedures Procedures    Medications Ordered in ED Medications  cefTRIAXone (ROCEPHIN) injection 500 mg (500 mg Intramuscular Given 10/12/22 2229)    ED Course/ Medical Decision Making/ A&P                             Medical Decision Making Amount and/or Complexity of Data Reviewed Labs: ordered. Radiology: ordered.  Risk Prescription drug management.   Patient is a 37 year old who presents with pelvic pain.  Seems to be across both sides of her lower abdomen.  On pelvic exam, she had some cervical motion tenderness along with some vaginal discharge.  She says that she has not had any new partners or exposures to sexually transmitted diseases.  Her labs are nonconcerning.  Her pregnancy test is negative.  Her wet prep was negative.  STD testing is pending.  Given her symptoms, we will go ahead and treat presumptively with Rocephin and doxycycline.  She did have a pelvic ultrasound which shows a cystic-like structure on her right ovary, potentially hemorrhagic cyst or endometriosis.  I discussed these findings with her.  There is no evidence of ovarian torsion.  She  is very comfortable and her symptoms do not sound consistent with torsion.  I did stress that is important to follow-up with an OB/GYN to have a repeat ultrasound to ensure that it is a benign appearing lesion.  I have given her information about following up with the women's outpatient clinic.  She was given strict return precautions.  Final Clinical Impression(s) / ED Diagnoses Final diagnoses:  Pelvic pain  Cyst of right ovary    Rx / DC Orders ED Discharge Orders          Ordered    doxycycline (VIBRAMYCIN) 100 MG capsule  2 times daily        10/12/22 2240              Malvin Johns, MD 10/12/22 2242

## 2022-10-13 LAB — GC/CHLAMYDIA PROBE AMP (~~LOC~~) NOT AT ARMC
Chlamydia: NEGATIVE
Comment: NEGATIVE
Comment: NORMAL
Neisseria Gonorrhea: NEGATIVE

## 2023-06-02 ENCOUNTER — Emergency Department (HOSPITAL_BASED_OUTPATIENT_CLINIC_OR_DEPARTMENT_OTHER): Payer: Self-pay | Admitting: Radiology

## 2023-06-02 ENCOUNTER — Encounter (HOSPITAL_BASED_OUTPATIENT_CLINIC_OR_DEPARTMENT_OTHER): Payer: Self-pay | Admitting: Emergency Medicine

## 2023-06-02 ENCOUNTER — Emergency Department (HOSPITAL_BASED_OUTPATIENT_CLINIC_OR_DEPARTMENT_OTHER)
Admission: EM | Admit: 2023-06-02 | Discharge: 2023-06-02 | Disposition: A | Discharge status: Home / Self Care | Payer: Self-pay | Attending: Emergency Medicine | Admitting: Emergency Medicine

## 2023-06-02 ENCOUNTER — Other Ambulatory Visit: Payer: Self-pay

## 2023-06-02 DIAGNOSIS — R0789 Other chest pain: Secondary | ICD-10-CM | POA: Insufficient documentation

## 2023-06-02 DIAGNOSIS — J069 Acute upper respiratory infection, unspecified: Secondary | ICD-10-CM | POA: Insufficient documentation

## 2023-06-02 DIAGNOSIS — Z20822 Contact with and (suspected) exposure to covid-19: Secondary | ICD-10-CM | POA: Insufficient documentation

## 2023-06-02 LAB — SARS CORONAVIRUS 2 BY RT PCR: SARS Coronavirus 2 by RT PCR: NEGATIVE

## 2023-06-02 MED ORDER — IBUPROFEN 400 MG PO TABS
600.0000 mg | ORAL_TABLET | Freq: Once | ORAL | Status: AC
  Administered 2023-06-02: 600 mg via ORAL
  Filled 2023-06-02: qty 1

## 2023-06-02 MED ORDER — ALBUTEROL SULFATE HFA 108 (90 BASE) MCG/ACT IN AERS
2.0000 | INHALATION_SPRAY | RESPIRATORY_TRACT | Status: DC | PRN
  Administered 2023-06-02: 2 via RESPIRATORY_TRACT
  Filled 2023-06-02: qty 6.7

## 2023-06-02 NOTE — ED Triage Notes (Signed)
Pt c/o SOB since this morning. Pt reports she woke up SOB but has had congestion and productive cough with yellow/green sputum since Monday. Pt reports son was dx with strep throat yesterday, but denies sore throat and states she had a tonsillectomy. Denies fever, N/V/D.

## 2023-06-02 NOTE — ED Provider Notes (Signed)
[place] EMERGENCY DEPARTMENT AT [facility] Provider Note   CSN: [id number] Arrival date & time: 06/02/23  1340     History  Chief Complaint  Patient presents with   Shortness of Breath    Jamie Owens is a 37 y.o. female past medical history of ADHD presents today for shortness of breath since this morning.  Patient reports she woke up short of breath but has had congestion and a productive cough with yellow/green sputum since Monday.  Patient states her middle son was diagnosed with strep throat yesterday.  Patient denies sore throat and has had a tonsillectomy.  Patient denies fever, nausea, vomiting, or diarrhea.  Patient endorses facial pressure and chest tightness.  Patient tried taking children's NyQuil and albuterol with no symptomatic relief.   Shortness of Breath      Home Medications Prior to Admission medications   Medication Sig Start Date End Date Taking? Authorizing Provider  doxycycline (VIBRAMYCIN) 100 MG capsule Take 1 capsule (100 mg total) by mouth 2 (two) times daily. One po bid x 7 days 10/12/22   Rolan Bucco, MD  famotidine (PEPCID) 20 MG tablet Take 1 tablet (20 mg total) by mouth 2 (two) times daily. 02/13/21   Theron Arista, PA-C  hydrocortisone 1 % lotion Apply 1 application topically 2 (two) times daily. 02/13/21   Theron Arista, PA-C  ondansetron (ZOFRAN-ODT) 4 MG disintegrating tablet Take 1 tablet (4 mg total) by mouth every 8 (eight) hours as needed for nausea or vomiting. 10/05/22   Horton, Mayer Masker, MD  predniSONE (DELTASONE) 10 MG tablet Take 2 tablets (20 mg total) by mouth daily. 02/13/21   Theron Arista, PA-C      Allergies    Patient has no known allergies.    Review of Systems   Review of Systems  HENT:  Positive for congestion.   Respiratory:  Positive for shortness of breath.     Physical Exam Updated Vital Signs BP 109/73   Pulse 83   Temp 98.7 F (37.1 C) (Oral)   Resp (!) 21   Ht 5\' 2"  (1.575 m)   Wt 67.6 kg   LMP  05/11/2023 (Approximate)   SpO2 98%   BMI 27.25 kg/m  Physical Exam Vitals and nursing note reviewed.  Constitutional:      General: She is not in acute distress.    Appearance: She is well-developed.  HENT:     Head: Normocephalic and atraumatic.     Mouth/Throat:     Mouth: Mucous membranes are moist.     Pharynx: Oropharynx is clear.  Eyes:     Conjunctiva/sclera: Conjunctivae normal.     Pupils: Pupils are equal, round, and reactive to light.  Cardiovascular:     Rate and Rhythm: Normal rate and regular rhythm.     Heart sounds: No murmur heard. Pulmonary:     Effort: Pulmonary effort is normal. No respiratory distress.     Comments: Coarse breath sounds in bilateral lower lobes Abdominal:     Palpations: Abdomen is soft.     Tenderness: There is no abdominal tenderness.  Musculoskeletal:        General: No swelling.     Cervical back: Neck supple.  Skin:    General: Skin is warm and dry.  Neurological:     General: No focal deficit present.     Mental Status: She is alert.  Psychiatric:        Mood and Affect: Mood normal.  ED Results / Procedures / Treatments   Labs (all labs ordered are listed, but only abnormal results are displayed) Labs Reviewed  SARS CORONAVIRUS 2 BY RT PCR    EKG EKG Interpretation Date/Time:  Wednesday June 02 2023 13:51:59 EDT Ventricular Rate:  76 PR Interval:  195 QRS Duration:  87 QT Interval:  357 QTC Calculation: 402 R Axis:   73  Text Interpretation: Sinus arrhythmia RSR' in V1 or V2, right VCD or RVH No significant change since last tracing Confirmed by Gwyneth Sprout ([id number]) on 06/02/2023 2:36:56 PM  Radiology DG Chest 2 View  Result Date: 06/02/2023 CLINICAL DATA:  Shortness of breath, congestion and productive cough EXAM: CHEST - 2 VIEW COMPARISON:  03/24/2020 FINDINGS: Cardiac and mediastinal contours are within normal limits. No focal pulmonary opacity. No pleural effusion or pneumothorax. No acute  osseous abnormality. IMPRESSION: No acute cardiopulmonary process. Electronically Signed   By: Wiliam Ke M.D.   On: 06/02/2023 16:14    Procedures Procedures    Medications Ordered in ED Medications  albuterol (VENTOLIN HFA) 108 (90 Base) MCG/ACT inhaler 2 puff (2 puffs Inhalation Given 06/02/23 1413)  ibuprofen (ADVIL) tablet 600 mg (600 mg Oral Given 06/02/23 1630)    ED Course/ Medical Decision Making/ A&P                                 Medical Decision Making Amount and/or Complexity of Data Reviewed Radiology: ordered.  Risk Prescription drug management.   This patient presents to the ED with chief complaint(s) of shortness of breath with pertinent past medical history of tonsillectomy which further complicates the presenting complaint. The complaint involves an extensive differential diagnosis and also carries with it a high risk of complications and morbidity.    The differential diagnosis includes pneumonia, upper respiratory infection, arrhythmia   ED Course and Reassessment: Given ibuprofen for headache  Independent labs interpretation:  The following labs were independently interpreted:  COVID: Negative EKG: Sinus arrhythmia which is unchanged from previous EKG  Independent visualization of imaging: - I independently visualized the following imaging with scope of interpretation limited to determining acute life threatening conditions related to emergency care: Chest x-ray, which revealed no acute cardiopulmonary abnormality.  Consultation: - Consulted or discussed management/test interpretation w/ external professional: None  Consideration for admission or further workup: Considered for admission further workup however patient's physical exam, labs, and imaging were reassuring.  If symptoms continue patient should follow-up with PCP outpatient for further evaluation.         Final Clinical Impression(s) / ED Diagnoses Final diagnoses:  Upper  respiratory tract infection, unspecified type    Rx / DC Orders ED Discharge Orders     None         Dolphus Jenny, PA-C 06/02/23 1645    Gwyneth Sprout, MD 06/04/23 [phone]

## 2023-06-02 NOTE — Discharge Instructions (Addendum)
Today you were seen for an upper respiratory infection.  You may take over-the-counter cold medicines as needed.  Thank you for letting us treat you today. After reviewing your labs and imaging, I feel you are safe to go home. Please follow up with your PCP in the next several days and provide them with your records from this visit. Return to the Emergency Room if pain becomes severe or symptoms worsen.

## 2024-06-28 ENCOUNTER — Encounter (HOSPITAL_BASED_OUTPATIENT_CLINIC_OR_DEPARTMENT_OTHER): Payer: Self-pay

## 2024-06-28 ENCOUNTER — Other Ambulatory Visit: Payer: Self-pay

## 2024-06-28 ENCOUNTER — Emergency Department (HOSPITAL_BASED_OUTPATIENT_CLINIC_OR_DEPARTMENT_OTHER): Payer: Self-pay | Admitting: Radiology

## 2024-06-28 ENCOUNTER — Emergency Department (HOSPITAL_BASED_OUTPATIENT_CLINIC_OR_DEPARTMENT_OTHER)
Admission: EM | Admit: 2024-06-28 | Discharge: 2024-06-28 | Disposition: A | Discharge status: Home / Self Care | Payer: Self-pay | Attending: Emergency Medicine | Admitting: Emergency Medicine

## 2024-06-28 DIAGNOSIS — X500XXA Overexertion from strenuous movement or load, initial encounter: Secondary | ICD-10-CM | POA: Insufficient documentation

## 2024-06-28 DIAGNOSIS — S39012A Strain of muscle, fascia and tendon of lower back, initial encounter: Secondary | ICD-10-CM | POA: Insufficient documentation

## 2024-06-28 LAB — PREGNANCY, URINE: Preg Test, Ur: NEGATIVE

## 2024-06-28 MED ORDER — PREDNISONE 10 MG PO TABS: ORAL_TABLET | ORAL | 0 refills | Status: AC

## 2024-06-28 MED ORDER — HYDROCODONE-ACETAMINOPHEN 5-325 MG PO TABS: 1.0000 | ORAL_TABLET | Freq: Four times a day (QID) | ORAL | 0 refills | Status: DC | PRN

## 2024-06-28 MED ORDER — PREDNISONE 50 MG PO TABS
60.0000 mg | ORAL_TABLET | Freq: Once | ORAL | Status: AC
  Administered 2024-06-28: 60 mg via ORAL
  Filled 2024-06-28: qty 1

## 2024-06-28 MED ORDER — HYDROCODONE-ACETAMINOPHEN 5-325 MG PO TABS
1.0000 | ORAL_TABLET | Freq: Once | ORAL | Status: AC
  Administered 2024-06-28: 1 via ORAL
  Filled 2024-06-28: qty 1

## 2024-06-28 MED ORDER — HYDROCODONE-ACETAMINOPHEN 5-325 MG PO TABS: 1.0000 | ORAL_TABLET | Freq: Four times a day (QID) | ORAL | 0 refills | Status: AC | PRN

## 2024-06-28 NOTE — Discharge Instructions (Signed)
 You are being prescribed the medications to help you with your back pain.  Take your next dose of prednisone  tomorrow.  I recommend heating pad applied to your back for 20 minutes 3-4 times daily, use caution, do not fall asleep on a heating pad as this can burn your skin.  You have also been prescribed a small quantity of hydrocodone  to help you with your pain, do not drive within 4 hours of taking this medication.   Avoid lifting,  Bending,  Twisting or any other activity that worsens your pain over the next week.   You should get rechecked if your symptoms are not better over the next 5 days,  Or you develop increased pain,  Weakness in your leg(s) or loss of bladder or bowel function - these can be symptoms of a worsening condition.  As discussed, your x-ray is negative for acute back findings but your right tubal ligation clip has migrated into your left lower pelvic region suggesting that it is possible you could get pregnant as your right tube is not adequately blocked.  I do recommend another form of birth control until you are seen by gynecology for this concern.

## 2024-06-28 NOTE — ED Provider Notes (Signed)
 [place] EMERGENCY DEPARTMENT AT [facility] Provider Note   CSN: [id number] Arrival date & time: 06/28/24  1534     Patient presents with: Back Pain   Jamie Owens is a 38 y.o. female.   The history is provided by the patient.  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 Authorizing Provider  predniSONE  (DELTASONE ) 10 MG tablet 6, 5, 4, 3, 2 then 1 tablet by mouth daily for 6 days total. 06/28/24  Yes Michal Callicott, PA-C  doxycycline  (VIBRAMYCIN ) 100 MG capsule Take 1 capsule (100 mg total) by mouth 2 (two) times daily. One po bid x 7 days 10/12/22   Lenor Hollering, MD  famotidine  (PEPCID ) 20 MG tablet Take 1 tablet (20 mg total) by mouth 2 (two) times daily. 02/13/21   Emelia Sluder, PA-C  HYDROcodone -acetaminophen  (NORCO/VICODIN) 5-325 MG tablet Take 1 tablet by mouth every 6 (six) hours as needed for moderate pain (pain score 4-6). 06/28/24   Xoey Warmoth, PA-C  hydrocortisone  1 % lotion Apply 1 application topically 2 (two) times daily. 02/13/21   Emelia Sluder, PA-C  ondansetron  (ZOFRAN -ODT) 4 MG disintegrating tablet Take 1 tablet (4 mg total) by mouth every 8 (eight) hours as needed for nausea or vomiting. 10/05/22   Horton, Charmaine FALCON, MD     Allergies: Patient has no known allergies.    Review of Systems  Genitourinary:  Negative for bladder incontinence and dysuria.  Musculoskeletal:  Positive for back pain.  Neurological:  Negative for tingling, weakness, numbness and paresthesias.    Updated Vital Signs BP 119/68   Pulse 74   Temp 98 F (36.7 C) (Oral)   Resp 18   Ht 5' 2 (1.575 m)   Wt 74.8 kg   SpO2 100%   BMI 30.18 kg/m   Physical Exam  (all labs ordered are listed, but only abnormal results are displayed) Labs Reviewed  PREGNANCY, URINE    EKG: None  Radiology: DG Lumbar Spine Complete Result Date: 06/28/2024 CLINICAL DATA:  Right-sided back pain. EXAM: DG LUMBAR SPINE COMPLETE 4+V COMPARISON:  None Available. FINDINGS: Five lumbar type vertebra. There is no acute fracture or subluxation of the lumbar spine. Mild lower thoracic chronic pain compression fractures. The visualized posterior elements are intact. The soft tissues are unremarkable. Two tubal ligation clips noted in the left hemiabdomen. The right tubal ligation clip appears displaced and located in the left lower abdomen. IMPRESSION: 1. No acute/traumatic lumbar spine pathology. 2. Displaced right tubal ligation clip. Electronically Signed   By: Vanetta Chou M.D.   On: 06/28/2024 18:00     Procedures   Medications Ordered in the ED  HYDROcodone -acetaminophen  (NORCO/VICODIN) 5-325 MG per tablet 1 tablet (1 tablet Oral Given 06/28/24 1659)  predniSONE  (DELTASONE ) tablet 60  mg (60 mg Oral Given 06/28/24 1658)                                    Medical Decision Making No neuro deficit on exam or by history to suggest emergent or surgical presentation.  No suggestion of cauda equina, no urinary complaints, pain is reproducible suggesting musculoskeletal source.  We discussed home treatment including role of ice and heat, she was given a prednisone  taper along with a few hydrocodone  tablets.  Advised activity as tolerated.  Outlined  return precautions.  We also discussed findings on her x-ray regarding her right surgical tubal ligation clip which is migrated to her left pelvis.  She was encouraged to follow-up with GYN and to use another form of birth control.      Amount and/or Complexity of Data Reviewed Labs: ordered. Radiology: ordered.    Details: Reviewed, no acute findings, I agree with interpretation.  Risk Prescription drug management.        Final diagnoses:  Strain of lumbar region, initial encounter    ED Discharge Orders          Ordered    HYDROcodone -acetaminophen  (NORCO/VICODIN) 5-325 MG tablet  Every 6 hours PRN,   Status:  Discontinued        06/28/24 1845    predniSONE  (DELTASONE ) 10 MG tablet        06/28/24 1845    HYDROcodone -acetaminophen  (NORCO/VICODIN) 5-325 MG tablet  Every 6 hours PRN        06/28/24 1847               Yina Riviere, PA-C 06/28/24 1850    Pamella Ozell LABOR, DO 07/05/24 [phone]

## 2024-06-28 NOTE — ED Triage Notes (Signed)
 Pt reports R sided back pain x3 days.

## 2024-06-28 NOTE — ED Notes (Signed)
 Reviewed AVS/discharge instruction with patient. Time allotted for and all questions answered. Patient is agreeable for d/c and escorted to ed exit by staff.

## 2024-08-24 ENCOUNTER — Emergency Department (HOSPITAL_COMMUNITY)
Admission: EM | Admit: 2024-08-24 | Discharge: 2024-08-24 | Disposition: A | Discharge status: Home / Self Care | Payer: Self-pay | Attending: Emergency Medicine | Admitting: Emergency Medicine

## 2024-08-24 ENCOUNTER — Encounter (HOSPITAL_COMMUNITY): Payer: Self-pay | Admitting: *Deleted

## 2024-08-24 ENCOUNTER — Other Ambulatory Visit: Payer: Self-pay

## 2024-08-24 DIAGNOSIS — Z79899 Other long term (current) drug therapy: Secondary | ICD-10-CM | POA: Insufficient documentation

## 2024-08-24 DIAGNOSIS — R1030 Lower abdominal pain, unspecified: Secondary | ICD-10-CM | POA: Insufficient documentation

## 2024-08-24 DIAGNOSIS — R112 Nausea with vomiting, unspecified: Secondary | ICD-10-CM | POA: Insufficient documentation

## 2024-08-24 LAB — URINALYSIS, ROUTINE W REFLEX MICROSCOPIC
Bacteria, UA: NONE SEEN
Bilirubin Urine: NEGATIVE
Glucose, UA: NEGATIVE mg/dL
Ketones, ur: NEGATIVE mg/dL
Leukocytes,Ua: NEGATIVE
Nitrite: NEGATIVE
Protein, ur: 30 mg/dL — AB
Specific Gravity, Urine: 1.029 (ref 1.005–1.030)
pH: 5 (ref 5.0–8.0)

## 2024-08-24 LAB — CBC WITH DIFFERENTIAL/PLATELET
Abs Immature Granulocytes: 0.02 K/uL (ref 0.00–0.07)
Basophils Absolute: 0.1 K/uL (ref 0.0–0.1)
Basophils Relative: 1 %
Eosinophils Absolute: 0.1 K/uL (ref 0.0–0.5)
Eosinophils Relative: 1 %
HCT: 39.1 % (ref 36.0–46.0)
Hemoglobin: 12.8 g/dL (ref 12.0–15.0)
Immature Granulocytes: 0 %
Lymphocytes Relative: 40 %
Lymphs Abs: 3 K/uL (ref 0.7–4.0)
MCH: 30.6 pg (ref 26.0–34.0)
MCHC: 32.7 g/dL (ref 30.0–36.0)
MCV: 93.5 fL (ref 80.0–100.0)
Monocytes Absolute: 0.6 K/uL (ref 0.1–1.0)
Monocytes Relative: 7 %
Neutro Abs: 3.8 K/uL (ref 1.7–7.7)
Neutrophils Relative %: 51 %
Platelets: 335 K/uL (ref 150–400)
RBC: 4.18 MIL/uL (ref 3.87–5.11)
RDW: 13.5 % (ref 11.5–15.5)
WBC: 7.6 K/uL (ref 4.0–10.5)
nRBC: 0 % (ref 0.0–0.2)

## 2024-08-24 LAB — COMPREHENSIVE METABOLIC PANEL WITH GFR
ALT: 7 U/L (ref 0–44)
AST: 15 U/L (ref 15–41)
Albumin: 4.2 g/dL (ref 3.5–5.0)
Alkaline Phosphatase: 70 U/L (ref 38–126)
Anion gap: 13 (ref 5–15)
BUN: 14 mg/dL (ref 6–20)
CO2: 20 mmol/L — ABNORMAL LOW (ref 22–32)
Calcium: 9 mg/dL (ref 8.9–10.3)
Chloride: 106 mmol/L (ref 98–111)
Creatinine, Ser: 0.5 mg/dL (ref 0.44–1.00)
GFR, Estimated: >60 mL/min
Glucose, Bld: 87 mg/dL (ref 70–99)
Potassium: 4.1 mmol/L (ref 3.5–5.1)
Sodium: 138 mmol/L (ref 135–145)
Total Bilirubin: 0.5 mg/dL (ref 0.0–1.2)
Total Protein: 6.7 g/dL (ref 6.5–8.1)

## 2024-08-24 LAB — HCG, SERUM, QUALITATIVE: Preg, Serum: NEGATIVE

## 2024-08-24 LAB — LIPASE, BLOOD: Lipase: 22 U/L (ref 11–51)

## 2024-08-24 NOTE — ED Triage Notes (Signed)
 Pt with abd pain off and on since November, seen at [facility] and was told a clip came off one of her fallopian tubes. + N/V

## 2024-08-24 NOTE — ED Provider Notes (Signed)
 " [place] EMERGENCY DEPARTMENT AT [facility] Provider Note   CSN: [id number] Arrival date & time: 08/24/24  1045     Patient presents with: Abdominal Pain   Jamie Owens is a 39 y.o. female.   Patient is a 39 year old female who presents emergency department with a chief complaint of intermittent lower abdominal pain and nausea for the past few months.  She notes that she was evaluated at [facility] a few months ago and at that point they noted that one of the clips for her tubal ligation had came off.  Patient is concerned that she could be pregnant.  She has had no vaginal discharge or bleeding.  There has been no dysuria or hematuria.  She has had no diarrhea or constipation.   Abdominal Pain      Prior to Admission medications  Medication Sig Start Date End Date Taking? Authorizing Provider  doxycycline  (VIBRAMYCIN ) 100 MG capsule Take 1 capsule (100 mg total) by mouth 2 (two) times daily. One po bid x 7 days 10/12/22   Lenor Hollering, MD  famotidine  (PEPCID ) 20 MG tablet Take 1 tablet (20 mg total) by mouth 2 (two) times daily. 02/13/21   Emelia Sluder, PA-C  HYDROcodone -acetaminophen  (NORCO/VICODIN) 5-325 MG tablet Take 1 tablet by mouth every 6 (six) hours as needed for moderate pain (pain score 4-6). 06/28/24   Idol, Julie, PA-C  hydrocortisone  1 % lotion Apply 1 application topically 2 (two) times daily. 02/13/21   Emelia Sluder, PA-C  ondansetron  (ZOFRAN -ODT) 4 MG disintegrating tablet Take 1 tablet (4 mg total) by mouth every 8 (eight) hours as needed for nausea or vomiting. 10/05/22   Horton, Charmaine FALCON, MD  predniSONE  (DELTASONE ) 10 MG tablet 6, 5, 4, 3, 2 then 1 tablet by mouth daily for 6 days total. 06/28/24   Idol, Mliss, PA-C    Allergies: Patient has no known allergies.    Review of Systems  Gastrointestinal:  Positive for abdominal pain.  All other systems reviewed and are negative.   Updated Vital Signs BP (!) 134/90 (BP Location: Right Arm)   Pulse  61   Resp 18   Ht 5' 2 (1.575 m)   Wt 74.8 kg   LMP 07/31/2024 (Approximate)   SpO2 100%   BMI 30.18 kg/m   Physical Exam Vitals and nursing note reviewed.  Constitutional:      General: She is not in acute distress.    Appearance: Normal appearance. She is not ill-appearing.  HENT:     Head: Normocephalic and atraumatic.     Nose: Nose normal.     Mouth/Throat:     Mouth: Mucous membranes are moist.  Eyes:     Extraocular Movements: Extraocular movements intact.     Conjunctiva/sclera: Conjunctivae normal.     Pupils: Pupils are equal, round, and reactive to light.  Cardiovascular:     Rate and Rhythm: Normal rate and regular rhythm.     Pulses: Normal pulses.     Heart sounds: Normal heart sounds. No murmur heard.    No gallop.  Pulmonary:     Effort: Pulmonary effort is normal. No respiratory distress.     Breath sounds: Normal breath sounds. No stridor. No wheezing, rhonchi or rales.  Abdominal:     General: Abdomen is flat. Bowel sounds are normal. There is no distension.     Palpations: Abdomen is soft.     Tenderness: There is abdominal tenderness in the suprapubic area.  Hernia: A hernia is present.  Musculoskeletal:        General: Normal range of motion.     Cervical back: Normal range of motion and neck supple.  Skin:    General: Skin is warm and dry.  Neurological:     General: No focal deficit present.     Mental Status: She is alert and oriented to person, place, and time. Mental status is at baseline.  Psychiatric:        Mood and Affect: Mood normal.        Behavior: Behavior normal.        Thought Content: Thought content normal.        Judgment: Judgment normal.     (all labs ordered are listed, but only abnormal results are displayed) Labs Reviewed  COMPREHENSIVE METABOLIC PANEL WITH GFR  CBC WITH DIFFERENTIAL/PLATELET  URINALYSIS, ROUTINE W REFLEX MICROSCOPIC  LIPASE, BLOOD  HCG, SERUM, QUALITATIVE    EKG: None  Radiology: No  results found.   Procedures   Medications Ordered in the ED - No data to display                                  Medical Decision Making Amount and/or Complexity of Data Reviewed Labs: ordered.   This patient presents to the ED for concern of abdominal pain differential diagnosis includes acute appendicitis, cholecystitis, bowel obstruction, diverticulitis, ovarian torsion or cyst, PID, tubo-ovarian abscess, pyelonephritis, kidney stone, pancreatitis, mesenteric ischemia, ectopic pregnancy    Additional history obtained:  Additional history obtained from none External records from outside source obtained and reviewed including medical records   Lab Tests:  I Ordered, and personally interpreted labs.  The pertinent results include: No leukocytosis, no anemia, normal kidney function liver function, unremarkable electrolytes, normal lipase, negative pregnancy test    Problem List / ED Course:  Patient is doing well at this time and is stable for discharge home.  Patient did elect to leave the emergency department before her urinalysis results have been resulted.  Will monitor urinalysis and contact the patient for any abnormalities.  Patient has been having ongoing symptoms for the past few months.  Blood work has been unremarkable at this time.  Low suspicion for intra-abdominal surgical process.  Close follow-up with PCP was discussed as well as strict turn precautions for any new or worsening symptoms.  Patient voiced understanding and had no additional questions.   Social Determinants of Health:  None        Final diagnoses:  None    ED Discharge Orders     None          Daralene Lonni JONETTA DEVONNA 08/24/24 1240    Towana Ozell BROCKS, MD 08/24/24 1746  "

## 2024-08-24 NOTE — Discharge Instructions (Signed)
